# Patient Record
Sex: Male | Born: 1935 | Race: White | Hispanic: No | Marital: Married
Health system: Southern US, Community
[De-identification: ages and names within clinical notes are randomized; demographics above are authoritative.]

---

## 2004-12-23 ENCOUNTER — Emergency Department: Payer: Self-pay | Admitting: Unknown Physician Specialty

## 2005-09-24 ENCOUNTER — Inpatient Hospital Stay: Payer: Self-pay | Admitting: Psychiatry

## 2005-09-24 ENCOUNTER — Other Ambulatory Visit: Payer: Self-pay

## 2009-06-27 ENCOUNTER — Emergency Department: Payer: Self-pay | Admitting: Emergency Medicine

## 2011-01-28 ENCOUNTER — Ambulatory Visit: Payer: Self-pay | Admitting: Otolaryngology

## 2011-02-11 ENCOUNTER — Ambulatory Visit: Payer: Self-pay | Admitting: Otolaryngology

## 2011-03-14 ENCOUNTER — Ambulatory Visit: Payer: Self-pay | Admitting: Internal Medicine

## 2011-05-20 ENCOUNTER — Ambulatory Visit: Payer: Self-pay | Admitting: Internal Medicine

## 2011-06-11 ENCOUNTER — Ambulatory Visit: Payer: Self-pay | Admitting: Vascular Surgery

## 2011-06-12 ENCOUNTER — Ambulatory Visit: Payer: Self-pay | Admitting: Vascular Surgery

## 2011-06-18 ENCOUNTER — Inpatient Hospital Stay: Payer: Self-pay | Admitting: Vascular Surgery

## 2011-06-20 LAB — PATHOLOGY REPORT

## 2011-08-15 ENCOUNTER — Observation Stay: Payer: Self-pay | Admitting: Internal Medicine

## 2011-08-22 ENCOUNTER — Inpatient Hospital Stay: Payer: Self-pay | Admitting: Vascular Surgery

## 2011-08-25 LAB — PATHOLOGY REPORT

## 2011-12-08 ENCOUNTER — Ambulatory Visit: Payer: Self-pay | Admitting: Ophthalmology

## 2012-01-26 ENCOUNTER — Ambulatory Visit: Payer: Self-pay | Admitting: Ophthalmology

## 2012-04-07 ENCOUNTER — Emergency Department: Payer: Self-pay | Admitting: Emergency Medicine

## 2012-04-07 LAB — TROPONIN I
Troponin-I: <0.02 ng/mL
Troponin-I: 0.03 ng/mL

## 2012-04-07 LAB — URINALYSIS, COMPLETE
Bilirubin,UR: NEGATIVE
Glucose,UR: NEGATIVE mg/dL (ref 0–75)
Leukocyte Esterase: NEGATIVE
Nitrite: NEGATIVE
Ph: 8 (ref 4.5–8.0)
Protein: NEGATIVE
RBC,UR: 2 /HPF (ref 0–5)
Specific Gravity: 1.008 (ref 1.003–1.030)
Squamous Epithelial: NONE SEEN

## 2012-04-07 LAB — COMPREHENSIVE METABOLIC PANEL
Albumin: 4.5 g/dL (ref 3.4–5.0)
Alkaline Phosphatase: 76 U/L (ref 50–136)
BUN: 13 mg/dL (ref 7–18)
Chloride: 97 mmol/L — ABNORMAL LOW (ref 98–107)
Co2: 28 mmol/L (ref 21–32)
Creatinine: 0.95 mg/dL (ref 0.60–1.30)
EGFR (African American): >60
EGFR (Non-African Amer.): >60
Glucose: 104 mg/dL — ABNORMAL HIGH (ref 65–99)
Potassium: 4.5 mmol/L (ref 3.5–5.1)
SGOT(AST): 27 U/L (ref 15–37)
Sodium: 131 mmol/L — ABNORMAL LOW (ref 136–145)
Total Protein: 8.2 g/dL (ref 6.4–8.2)

## 2012-04-07 LAB — CBC
HGB: 15.7 g/dL (ref 13.0–18.0)
MCH: 31.6 pg (ref 26.0–34.0)
RBC: 4.98 10*6/uL (ref 4.40–5.90)
RDW: 13.1 % (ref 11.5–14.5)
WBC: 8.6 10*3/uL (ref 3.8–10.6)

## 2012-04-19 ENCOUNTER — Ambulatory Visit: Payer: Self-pay | Admitting: Otolaryngology

## 2014-03-16 LAB — CBC
HCT: 43.1 % (ref 40.0–52.0)
HGB: 14.3 g/dL (ref 13.0–18.0)
MCH: 31.2 pg (ref 26.0–34.0)
MCHC: 33.1 g/dL (ref 32.0–36.0)
MCV: 94 fL (ref 80–100)
PLATELETS: 236 10*3/uL (ref 150–440)
RBC: 4.58 10*6/uL (ref 4.40–5.90)
RDW: 12.9 % (ref 11.5–14.5)
WBC: 8.7 10*3/uL (ref 3.8–10.6)

## 2014-03-16 LAB — COMPREHENSIVE METABOLIC PANEL
Albumin: 4 g/dL (ref 3.4–5.0)
Alkaline Phosphatase: 79 U/L
Anion Gap: 9 (ref 7–16)
BUN: 14 mg/dL (ref 7–18)
Bilirubin,Total: 0.5 mg/dL (ref 0.2–1.0)
CHLORIDE: 91 mmol/L — AB (ref 98–107)
CO2: 28 mmol/L (ref 21–32)
CREATININE: 1.07 mg/dL (ref 0.60–1.30)
Calcium, Total: 9.6 mg/dL (ref 8.5–10.1)
EGFR (African American): >60
Glucose: 116 mg/dL — ABNORMAL HIGH (ref 65–99)
Osmolality: 259 (ref 275–301)
POTASSIUM: 4 mmol/L (ref 3.5–5.1)
SGOT(AST): 20 U/L (ref 15–37)
SGPT (ALT): 18 U/L (ref 12–78)
SODIUM: 128 mmol/L — AB (ref 136–145)
Total Protein: 7.5 g/dL (ref 6.4–8.2)

## 2014-03-16 LAB — URINALYSIS, COMPLETE
BLOOD: NEGATIVE
Bilirubin,UR: NEGATIVE
Glucose,UR: NEGATIVE mg/dL (ref 0–75)
Ketone: NEGATIVE
Nitrite: NEGATIVE
PH: 7 (ref 4.5–8.0)
PROTEIN: NEGATIVE
RBC,UR: 3 /HPF (ref 0–5)
SPECIFIC GRAVITY: 1.009 (ref 1.003–1.030)
SQUAMOUS EPITHELIAL: NONE SEEN
WBC UR: 5 /HPF (ref 0–5)

## 2014-03-16 LAB — TROPONIN I: Troponin-I: <0.02 ng/mL

## 2014-03-17 ENCOUNTER — Observation Stay: Payer: Self-pay | Admitting: Internal Medicine

## 2014-03-17 LAB — COMPREHENSIVE METABOLIC PANEL
ALK PHOS: 63 U/L
ALT: 18 U/L (ref 12–78)
AST: 23 U/L (ref 15–37)
Albumin: 3.7 g/dL (ref 3.4–5.0)
Anion Gap: 9 (ref 7–16)
BUN: 14 mg/dL (ref 7–18)
Bilirubin,Total: 0.8 mg/dL (ref 0.2–1.0)
CALCIUM: 9.8 mg/dL (ref 8.5–10.1)
CHLORIDE: 96 mmol/L — AB (ref 98–107)
Co2: 26 mmol/L (ref 21–32)
Creatinine: 0.89 mg/dL (ref 0.60–1.30)
EGFR (African American): >60
EGFR (Non-African Amer.): >60
Glucose: 101 mg/dL — ABNORMAL HIGH (ref 65–99)
OSMOLALITY: 263 (ref 275–301)
Potassium: 3.9 mmol/L (ref 3.5–5.1)
Sodium: 131 mmol/L — ABNORMAL LOW (ref 136–145)
Total Protein: 7 g/dL (ref 6.4–8.2)

## 2014-03-17 LAB — CBC WITH DIFFERENTIAL/PLATELET
BASOS ABS: 0 10*3/uL (ref 0.0–0.1)
BASOS PCT: 0.6 %
EOS ABS: 0.1 10*3/uL (ref 0.0–0.7)
Eosinophil %: 1.1 %
HCT: 41.9 % (ref 40.0–52.0)
HGB: 14.3 g/dL (ref 13.0–18.0)
Lymphocyte #: 1.9 10*3/uL (ref 1.0–3.6)
Lymphocyte %: 24.8 %
MCH: 31.9 pg (ref 26.0–34.0)
MCHC: 34.2 g/dL (ref 32.0–36.0)
MCV: 93 fL (ref 80–100)
MONO ABS: 0.7 x10 3/mm (ref 0.2–1.0)
MONOS PCT: 9.6 %
NEUTROS ABS: 4.9 10*3/uL (ref 1.4–6.5)
Neutrophil %: 63.9 %
Platelet: 204 10*3/uL (ref 150–440)
RBC: 4.5 10*6/uL (ref 4.40–5.90)
RDW: 12.7 % (ref 11.5–14.5)
WBC: 7.7 10*3/uL (ref 3.8–10.6)

## 2014-03-17 LAB — LIPID PANEL
CHOLESTEROL: 109 mg/dL (ref 0–200)
HDL Cholesterol: 47 mg/dL (ref 40–60)
Ldl Cholesterol, Calc: 47 mg/dL (ref 0–100)
TRIGLYCERIDES: 76 mg/dL (ref 0–200)
VLDL CHOLESTEROL, CALC: 15 mg/dL (ref 5–40)

## 2014-03-17 LAB — TSH: Thyroid Stimulating Horm: 0.64 u[IU]/mL

## 2014-03-17 LAB — MAGNESIUM: Magnesium: 1.9 mg/dL

## 2014-03-18 LAB — CBC WITH DIFFERENTIAL/PLATELET
Basophil #: 0 10*3/uL (ref 0.0–0.1)
Basophil %: 0.6 %
EOS PCT: 1.4 %
Eosinophil #: 0.1 10*3/uL (ref 0.0–0.7)
HCT: 45.4 % (ref 40.0–52.0)
HGB: 15.5 g/dL (ref 13.0–18.0)
Lymphocyte #: 1.8 10*3/uL (ref 1.0–3.6)
Lymphocyte %: 23.6 %
MCH: 32.1 pg (ref 26.0–34.0)
MCHC: 34.2 g/dL (ref 32.0–36.0)
MCV: 94 fL (ref 80–100)
MONO ABS: 0.9 x10 3/mm (ref 0.2–1.0)
Monocyte %: 11.4 %
NEUTROS ABS: 4.8 10*3/uL (ref 1.4–6.5)
Neutrophil %: 63 %
PLATELETS: 209 10*3/uL (ref 150–440)
RBC: 4.85 10*6/uL (ref 4.40–5.90)
RDW: 13.1 % (ref 11.5–14.5)
WBC: 7.6 10*3/uL (ref 3.8–10.6)

## 2014-03-18 LAB — BASIC METABOLIC PANEL
Anion Gap: 4 — ABNORMAL LOW (ref 7–16)
BUN: 13 mg/dL (ref 7–18)
CALCIUM: 9.9 mg/dL (ref 8.5–10.1)
Chloride: 99 mmol/L (ref 98–107)
Co2: 27 mmol/L (ref 21–32)
Creatinine: 1 mg/dL (ref 0.60–1.30)
EGFR (African American): >60
GLUCOSE: 104 mg/dL — AB (ref 65–99)
Osmolality: 261 (ref 275–301)
POTASSIUM: 3.9 mmol/L (ref 3.5–5.1)
Sodium: 130 mmol/L — ABNORMAL LOW (ref 136–145)

## 2014-03-27 ENCOUNTER — Ambulatory Visit: Payer: Self-pay | Admitting: Hematology and Oncology

## 2014-04-10 ENCOUNTER — Ambulatory Visit: Payer: Self-pay | Admitting: Otolaryngology

## 2014-04-20 ENCOUNTER — Emergency Department: Payer: Self-pay | Admitting: Emergency Medicine

## 2014-04-20 ENCOUNTER — Ambulatory Visit: Payer: Self-pay | Admitting: Otolaryngology

## 2014-04-20 LAB — COMPREHENSIVE METABOLIC PANEL
ALT: 23 U/L (ref 12–78)
Albumin: 3.9 g/dL (ref 3.4–5.0)
Alkaline Phosphatase: 78 U/L
Anion Gap: 5 — ABNORMAL LOW (ref 7–16)
BILIRUBIN TOTAL: 0.6 mg/dL (ref 0.2–1.0)
BUN: 15 mg/dL (ref 7–18)
CALCIUM: 10.3 mg/dL — AB (ref 8.5–10.1)
CHLORIDE: 95 mmol/L — AB (ref 98–107)
Co2: 28 mmol/L (ref 21–32)
Creatinine: 0.84 mg/dL (ref 0.60–1.30)
Glucose: 104 mg/dL — ABNORMAL HIGH (ref 65–99)
OSMOLALITY: 258 (ref 275–301)
Potassium: 5.1 mmol/L (ref 3.5–5.1)
SGOT(AST): 35 U/L (ref 15–37)
Sodium: 128 mmol/L — ABNORMAL LOW (ref 136–145)
TOTAL PROTEIN: 7.5 g/dL (ref 6.4–8.2)

## 2014-04-20 LAB — CBC WITH DIFFERENTIAL/PLATELET
Basophil #: 0.1 10*3/uL (ref 0.0–0.1)
Basophil %: 0.8 %
EOS ABS: 0.1 10*3/uL (ref 0.0–0.7)
Eosinophil %: 1.2 %
HCT: 44.1 % (ref 40.0–52.0)
HGB: 14.6 g/dL (ref 13.0–18.0)
Lymphocyte #: 1.8 10*3/uL (ref 1.0–3.6)
Lymphocyte %: 22.6 %
MCH: 30.9 pg (ref 26.0–34.0)
MCHC: 33.1 g/dL (ref 32.0–36.0)
MCV: 93 fL (ref 80–100)
MONOS PCT: 10.5 %
Monocyte #: 0.8 x10 3/mm (ref 0.2–1.0)
NEUTROS PCT: 64.9 %
Neutrophil #: 5.2 10*3/uL (ref 1.4–6.5)
PLATELETS: 241 10*3/uL (ref 150–440)
RBC: 4.72 10*6/uL (ref 4.40–5.90)
RDW: 13.1 % (ref 11.5–14.5)
WBC: 8 10*3/uL (ref 3.8–10.6)

## 2014-04-20 LAB — URINALYSIS, COMPLETE
BILIRUBIN, UR: NEGATIVE
Blood: NEGATIVE
GLUCOSE, UR: NEGATIVE mg/dL (ref 0–75)
Ketone: NEGATIVE
NITRITE: NEGATIVE
PH: 6 (ref 4.5–8.0)
PROTEIN: NEGATIVE
SPECIFIC GRAVITY: 1.012 (ref 1.003–1.030)
SQUAMOUS EPITHELIAL: NONE SEEN

## 2014-04-20 LAB — TROPONIN I: Troponin-I: <0.02 ng/mL

## 2014-04-21 ENCOUNTER — Inpatient Hospital Stay: Payer: Self-pay | Admitting: Internal Medicine

## 2014-04-21 ENCOUNTER — Ambulatory Visit: Payer: Self-pay | Admitting: Hematology and Oncology

## 2014-04-22 LAB — BASIC METABOLIC PANEL
Anion Gap: 7 (ref 7–16)
BUN: 14 mg/dL (ref 7–18)
CO2: 27 mmol/L (ref 21–32)
CREATININE: 0.96 mg/dL (ref 0.60–1.30)
Calcium, Total: 9.4 mg/dL (ref 8.5–10.1)
Chloride: 99 mmol/L (ref 98–107)
EGFR (African American): >60
EGFR (Non-African Amer.): >60
GLUCOSE: 146 mg/dL — AB (ref 65–99)
Osmolality: 269 (ref 275–301)
POTASSIUM: 4.2 mmol/L (ref 3.5–5.1)
Sodium: 133 mmol/L — ABNORMAL LOW (ref 136–145)

## 2014-04-22 LAB — CBC WITH DIFFERENTIAL/PLATELET
BASOS PCT: 0.2 %
Basophil #: 0 10*3/uL (ref 0.0–0.1)
EOS ABS: 0 10*3/uL (ref 0.0–0.7)
EOS PCT: 0 %
HCT: 41.8 % (ref 40.0–52.0)
HGB: 14 g/dL (ref 13.0–18.0)
Lymphocyte #: 0.8 10*3/uL — ABNORMAL LOW (ref 1.0–3.6)
Lymphocyte %: 17.6 %
MCH: 31.1 pg (ref 26.0–34.0)
MCHC: 33.4 g/dL (ref 32.0–36.0)
MCV: 93 fL (ref 80–100)
MONO ABS: 0.1 x10 3/mm — AB (ref 0.2–1.0)
Monocyte %: 1.4 %
NEUTROS PCT: 80.8 %
Neutrophil #: 3.9 10*3/uL (ref 1.4–6.5)
Platelet: 229 10*3/uL (ref 150–440)
RBC: 4.49 10*6/uL (ref 4.40–5.90)
RDW: 13 % (ref 11.5–14.5)
WBC: 4.8 10*3/uL (ref 3.8–10.6)

## 2014-04-22 LAB — MAGNESIUM: MAGNESIUM: 2.1 mg/dL

## 2014-04-23 LAB — CBC WITH DIFFERENTIAL/PLATELET
Basophil #: 0 10*3/uL (ref 0.0–0.1)
Basophil %: 0.2 %
EOS ABS: 0 10*3/uL (ref 0.0–0.7)
EOS PCT: 0 %
HCT: 42.2 % (ref 40.0–52.0)
HGB: 14.2 g/dL (ref 13.0–18.0)
Lymphocyte #: 1.3 10*3/uL (ref 1.0–3.6)
Lymphocyte %: 7.9 %
MCH: 31.6 pg (ref 26.0–34.0)
MCHC: 33.7 g/dL (ref 32.0–36.0)
MCV: 94 fL (ref 80–100)
MONOS PCT: 3.8 %
Monocyte #: 0.6 x10 3/mm (ref 0.2–1.0)
Neutrophil #: 14.1 10*3/uL — ABNORMAL HIGH (ref 1.4–6.5)
Neutrophil %: 88.1 %
Platelet: 251 10*3/uL (ref 150–440)
RBC: 4.49 10*6/uL (ref 4.40–5.90)
RDW: 13 % (ref 11.5–14.5)
WBC: 16 10*3/uL — ABNORMAL HIGH (ref 3.8–10.6)

## 2014-04-23 LAB — BASIC METABOLIC PANEL
Anion Gap: 6 — ABNORMAL LOW (ref 7–16)
BUN: 18 mg/dL (ref 7–18)
CALCIUM: 9.6 mg/dL (ref 8.5–10.1)
CREATININE: 1.04 mg/dL (ref 0.60–1.30)
Chloride: 99 mmol/L (ref 98–107)
Co2: 29 mmol/L (ref 21–32)
EGFR (Non-African Amer.): >60
Glucose: 141 mg/dL — ABNORMAL HIGH (ref 65–99)
Osmolality: 273 (ref 275–301)
POTASSIUM: 4.2 mmol/L (ref 3.5–5.1)
SODIUM: 134 mmol/L — AB (ref 136–145)

## 2014-04-24 LAB — CBC WITH DIFFERENTIAL/PLATELET
Basophil #: 0 10*3/uL (ref 0.0–0.1)
Basophil %: 0.1 %
EOS ABS: 0 10*3/uL (ref 0.0–0.7)
Eosinophil %: 0 %
HCT: 43.6 % (ref 40.0–52.0)
HGB: 14.3 g/dL (ref 13.0–18.0)
Lymphocyte #: 0.8 10*3/uL — ABNORMAL LOW (ref 1.0–3.6)
Lymphocyte %: 5.7 %
MCH: 30.6 pg (ref 26.0–34.0)
MCHC: 32.9 g/dL (ref 32.0–36.0)
MCV: 93 fL (ref 80–100)
Monocyte #: 0.4 x10 3/mm (ref 0.2–1.0)
Monocyte %: 3.4 %
NEUTROS ABS: 11.9 10*3/uL — AB (ref 1.4–6.5)
Neutrophil %: 90.8 %
PLATELETS: 256 10*3/uL (ref 150–440)
RBC: 4.68 10*6/uL (ref 4.40–5.90)
RDW: 13.2 % (ref 11.5–14.5)
WBC: 13.1 10*3/uL — ABNORMAL HIGH (ref 3.8–10.6)

## 2014-04-24 LAB — BASIC METABOLIC PANEL
ANION GAP: 5 — AB (ref 7–16)
BUN: 15 mg/dL (ref 7–18)
CALCIUM: 9.8 mg/dL (ref 8.5–10.1)
CO2: 30 mmol/L (ref 21–32)
Chloride: 99 mmol/L (ref 98–107)
Creatinine: 1 mg/dL (ref 0.60–1.30)
EGFR (African American): >60
Glucose: 132 mg/dL — ABNORMAL HIGH (ref 65–99)
Osmolality: 271 (ref 275–301)
Potassium: 4.2 mmol/L (ref 3.5–5.1)
SODIUM: 134 mmol/L — AB (ref 136–145)

## 2014-04-25 LAB — CBC WITH DIFFERENTIAL/PLATELET
BASOS ABS: 0 10*3/uL (ref 0.0–0.1)
BASOS PCT: 0.1 %
EOS PCT: 0 %
Eosinophil #: 0 10*3/uL (ref 0.0–0.7)
HCT: 47.7 % (ref 40.0–52.0)
HGB: 15.4 g/dL (ref 13.0–18.0)
Lymphocyte #: 0.8 10*3/uL — ABNORMAL LOW (ref 1.0–3.6)
Lymphocyte %: 6.2 %
MCH: 30.3 pg (ref 26.0–34.0)
MCHC: 32.3 g/dL (ref 32.0–36.0)
MCV: 94 fL (ref 80–100)
Monocyte #: 0.4 x10 3/mm (ref 0.2–1.0)
Monocyte %: 3.1 %
Neutrophil #: 10.9 10*3/uL — ABNORMAL HIGH (ref 1.4–6.5)
Neutrophil %: 90.6 %
Platelet: 264 10*3/uL (ref 150–440)
RBC: 5.08 10*6/uL (ref 4.40–5.90)
RDW: 13.5 % (ref 11.5–14.5)
WBC: 12.1 10*3/uL — ABNORMAL HIGH (ref 3.8–10.6)

## 2014-04-25 LAB — APTT: ACTIVATED PTT: 28.7 s (ref 23.6–35.9)

## 2014-04-25 LAB — BASIC METABOLIC PANEL
Anion Gap: 5 — ABNORMAL LOW (ref 7–16)
BUN: 19 mg/dL — ABNORMAL HIGH (ref 7–18)
CALCIUM: 9.6 mg/dL (ref 8.5–10.1)
Chloride: 96 mmol/L — ABNORMAL LOW (ref 98–107)
Co2: 33 mmol/L — ABNORMAL HIGH (ref 21–32)
Creatinine: 1.02 mg/dL (ref 0.60–1.30)
GLUCOSE: 147 mg/dL — AB (ref 65–99)
OSMOLALITY: 273 (ref 275–301)
Potassium: 3.9 mmol/L (ref 3.5–5.1)
SODIUM: 134 mmol/L — AB (ref 136–145)

## 2014-04-25 LAB — PROTIME-INR
INR: 1.1
Prothrombin Time: 13.9 secs (ref 11.5–14.7)

## 2014-04-26 ENCOUNTER — Ambulatory Visit: Payer: Self-pay | Admitting: Hematology and Oncology

## 2014-04-26 LAB — BASIC METABOLIC PANEL
ANION GAP: 4 — AB (ref 7–16)
BUN: 18 mg/dL (ref 7–18)
CALCIUM: 9.4 mg/dL (ref 8.5–10.1)
CHLORIDE: 97 mmol/L — AB (ref 98–107)
CO2: 33 mmol/L — AB (ref 21–32)
Creatinine: 0.82 mg/dL (ref 0.60–1.30)
EGFR (African American): >60
EGFR (Non-African Amer.): >60
Glucose: 137 mg/dL — ABNORMAL HIGH (ref 65–99)
OSMOLALITY: 272 (ref 275–301)
POTASSIUM: 3.8 mmol/L (ref 3.5–5.1)
Sodium: 134 mmol/L — ABNORMAL LOW (ref 136–145)

## 2014-04-26 LAB — CBC WITH DIFFERENTIAL/PLATELET
BASOS ABS: 0 10*3/uL (ref 0.0–0.1)
Basophil %: 0.1 %
EOS PCT: 0 %
Eosinophil #: 0 10*3/uL (ref 0.0–0.7)
HCT: 48.8 % (ref 40.0–52.0)
HGB: 15.7 g/dL (ref 13.0–18.0)
LYMPHS ABS: 0.7 10*3/uL — AB (ref 1.0–3.6)
Lymphocyte %: 6 %
MCH: 30.4 pg (ref 26.0–34.0)
MCHC: 32.2 g/dL (ref 32.0–36.0)
MCV: 94 fL (ref 80–100)
MONO ABS: 0.6 x10 3/mm (ref 0.2–1.0)
Monocyte %: 4.9 %
NEUTROS ABS: 10.3 10*3/uL — AB (ref 1.4–6.5)
Neutrophil %: 89 %
Platelet: 246 10*3/uL (ref 150–440)
RBC: 5.18 10*6/uL (ref 4.40–5.90)
RDW: 13.3 % (ref 11.5–14.5)
WBC: 11.6 10*3/uL — ABNORMAL HIGH (ref 3.8–10.6)

## 2014-04-27 LAB — PATHOLOGY REPORT

## 2014-04-28 LAB — BASIC METABOLIC PANEL
Anion Gap: 5 — ABNORMAL LOW (ref 7–16)
BUN: 31 mg/dL — ABNORMAL HIGH (ref 7–18)
CREATININE: 0.92 mg/dL (ref 0.60–1.30)
Calcium, Total: 9.2 mg/dL (ref 8.5–10.1)
Chloride: 97 mmol/L — ABNORMAL LOW (ref 98–107)
Co2: 33 mmol/L — ABNORMAL HIGH (ref 21–32)
EGFR (Non-African Amer.): >60
Glucose: 137 mg/dL — ABNORMAL HIGH (ref 65–99)
OSMOLALITY: 279 (ref 275–301)
POTASSIUM: 4.3 mmol/L (ref 3.5–5.1)
Sodium: 135 mmol/L — ABNORMAL LOW (ref 136–145)

## 2014-04-28 LAB — CBC WITH DIFFERENTIAL/PLATELET
BASOS PCT: 0.2 %
Basophil #: 0 10*3/uL (ref 0.0–0.1)
EOS ABS: 0 10*3/uL (ref 0.0–0.7)
Eosinophil %: 0 %
HCT: 41.1 % (ref 40.0–52.0)
HGB: 13.9 g/dL (ref 13.0–18.0)
LYMPHS PCT: 6.5 %
Lymphocyte #: 0.8 10*3/uL — ABNORMAL LOW (ref 1.0–3.6)
MCH: 31.7 pg (ref 26.0–34.0)
MCHC: 33.8 g/dL (ref 32.0–36.0)
MCV: 94 fL (ref 80–100)
Monocyte #: 0.9 x10 3/mm (ref 0.2–1.0)
Monocyte %: 6.7 %
NEUTROS PCT: 86.6 %
Neutrophil #: 11.2 10*3/uL — ABNORMAL HIGH (ref 1.4–6.5)
Platelet: 218 10*3/uL (ref 150–440)
RBC: 4.38 10*6/uL — ABNORMAL LOW (ref 4.40–5.90)
RDW: 13.3 % (ref 11.5–14.5)
WBC: 12.9 10*3/uL — ABNORMAL HIGH (ref 3.8–10.6)

## 2014-04-30 LAB — BASIC METABOLIC PANEL WITH GFR
Anion Gap: 4 — ABNORMAL LOW
BUN: 31 mg/dL — ABNORMAL HIGH
Calcium, Total: 10.2 mg/dL — ABNORMAL HIGH
Chloride: 94 mmol/L — ABNORMAL LOW
Co2: 35 mmol/L — ABNORMAL HIGH
Creatinine: 0.98 mg/dL
EGFR (African American): >60
EGFR (Non-African Amer.): >60
Glucose: 99 mg/dL
Osmolality: 273
Potassium: 4.6 mmol/L
Sodium: 133 mmol/L — ABNORMAL LOW

## 2014-04-30 LAB — CBC WITH DIFFERENTIAL/PLATELET
Basophil #: 0.1 x10 3/mm 3
Basophil %: 0.3 %
Eosinophil #: 0 x10 3/mm 3
Eosinophil %: 0.2 %
HCT: 47.2 %
HGB: 15.9 g/dL
Lymphocyte %: 13.2 %
Lymphs Abs: 2.4 x10 3/mm 3
MCH: 31.8 pg
MCHC: 33.7 g/dL
MCV: 94 fL
Monocyte #: 1.8 "x10 3/mm " — ABNORMAL HIGH
Monocyte %: 9.9 %
Neutrophil #: 14.1 x10 3/mm 3 — ABNORMAL HIGH
Neutrophil %: 76.4 %
Platelet: 235 x10 3/mm 3
RBC: 5.01 x10 6/mm 3
RDW: 13.3 %
WBC: 18.5 x10 3/mm 3 — ABNORMAL HIGH

## 2014-05-01 LAB — CBC WITH DIFFERENTIAL/PLATELET
BASOS ABS: 0 10*3/uL (ref 0.0–0.1)
BASOS PCT: 0.4 %
Basophil #: 0 10*3/uL (ref 0.0–0.1)
Basophil %: 0.2 %
EOS ABS: 0.1 10*3/uL (ref 0.0–0.7)
Eosinophil #: 0.1 10*3/uL (ref 0.0–0.7)
Eosinophil %: 0.6 %
Eosinophil %: 1 %
HCT: 44.7 % (ref 40.0–52.0)
HCT: 47.9 % (ref 40.0–52.0)
HGB: 14.6 g/dL (ref 13.0–18.0)
HGB: 15.3 g/dL (ref 13.0–18.0)
LYMPHS ABS: 1.4 10*3/uL (ref 1.0–3.6)
LYMPHS PCT: 14.3 %
LYMPHS PCT: 14.9 %
Lymphocyte #: 1.9 10*3/uL (ref 1.0–3.6)
MCH: 30.9 pg (ref 26.0–34.0)
MCH: 30.5 pg (ref 26.0–34.0)
MCHC: 32.7 g/dL (ref 32.0–36.0)
MCHC: 31.9 g/dL — ABNORMAL LOW (ref 32.0–36.0)
MCV: 94 fL (ref 80–100)
MCV: 96 fL (ref 80–100)
Monocyte #: 1.1 x10 3/mm — ABNORMAL HIGH (ref 0.2–1.0)
Monocyte #: 1.2 x10 3/mm — ABNORMAL HIGH (ref 0.2–1.0)
Monocyte %: 11.6 %
Monocyte %: 9.5 %
NEUTROS PCT: 73.3 %
Neutrophil #: 7.1 10*3/uL — ABNORMAL HIGH (ref 1.4–6.5)
Neutrophil #: 9.4 10*3/uL — ABNORMAL HIGH (ref 1.4–6.5)
Neutrophil %: 74.2 %
PLATELETS: 169 10*3/uL (ref 150–440)
Platelet: 172 10*3/uL (ref 150–440)
RBC: 4.74 10*6/uL (ref 4.40–5.90)
RBC: 5 10*6/uL (ref 4.40–5.90)
RDW: 13.1 % (ref 11.5–14.5)
RDW: 13.5 % (ref 11.5–14.5)
WBC: 9.7 10*3/uL (ref 3.8–10.6)
WBC: 12.6 10*3/uL — ABNORMAL HIGH (ref 3.8–10.6)

## 2014-05-01 LAB — BASIC METABOLIC PANEL
Anion Gap: 3 — ABNORMAL LOW (ref 7–16)
Anion Gap: 5 — ABNORMAL LOW (ref 7–16)
BUN: 31 mg/dL — AB (ref 7–18)
BUN: 28 mg/dL — AB (ref 7–18)
Calcium, Total: 9.3 mg/dL (ref 8.5–10.1)
Calcium, Total: 9.3 mg/dL (ref 8.5–10.1)
Chloride: 96 mmol/L — ABNORMAL LOW (ref 98–107)
Chloride: 99 mmol/L (ref 98–107)
Co2: 35 mmol/L — ABNORMAL HIGH (ref 21–32)
Co2: 31 mmol/L (ref 21–32)
Creatinine: 1.14 mg/dL (ref 0.60–1.30)
Creatinine: 1.2 mg/dL (ref 0.60–1.30)
EGFR (African American): >60
EGFR (Non-African Amer.): 57 — ABNORMAL LOW
Glucose: 92 mg/dL (ref 65–99)
Glucose: 99 mg/dL (ref 65–99)
Osmolality: 274 (ref 275–301)
Osmolality: 276 (ref 275–301)
POTASSIUM: 4.4 mmol/L (ref 3.5–5.1)
Potassium: 4.6 mmol/L (ref 3.5–5.1)
SODIUM: 134 mmol/L — AB (ref 136–145)
Sodium: 135 mmol/L — ABNORMAL LOW (ref 136–145)

## 2014-05-01 LAB — TROPONIN I: TROPONIN-I: 0.07 ng/mL — AB

## 2014-05-01 LAB — CK TOTAL AND CKMB (NOT AT ARMC)
CK, Total: 94 U/L
CK-MB: 2.8 ng/mL (ref 0.5–3.6)

## 2014-05-02 LAB — URINALYSIS, COMPLETE
Bilirubin,UR: NEGATIVE
Blood: NEGATIVE
Glucose,UR: NEGATIVE mg/dL (ref 0–75)
Ketone: NEGATIVE
NITRITE: NEGATIVE
PH: 7 (ref 4.5–8.0)
Protein: NEGATIVE
RBC,UR: 4 /HPF (ref 0–5)
SPECIFIC GRAVITY: 1.009 (ref 1.003–1.030)
Squamous Epithelial: NONE SEEN
WBC UR: 3 /HPF (ref 0–5)

## 2014-05-02 LAB — BASIC METABOLIC PANEL
ANION GAP: 4 — AB (ref 7–16)
BUN: 24 mg/dL — ABNORMAL HIGH (ref 7–18)
CALCIUM: 9.1 mg/dL (ref 8.5–10.1)
Chloride: 98 mmol/L (ref 98–107)
Co2: 33 mmol/L — ABNORMAL HIGH (ref 21–32)
Creatinine: 1.12 mg/dL (ref 0.60–1.30)
EGFR (African American): >60
Glucose: 145 mg/dL — ABNORMAL HIGH (ref 65–99)
Osmolality: 277 (ref 275–301)
POTASSIUM: 4.1 mmol/L (ref 3.5–5.1)
Sodium: 135 mmol/L — ABNORMAL LOW (ref 136–145)

## 2014-05-02 LAB — CBC WITH DIFFERENTIAL/PLATELET
BASOS PCT: 0.6 %
Basophil #: 0.1 10*3/uL (ref 0.0–0.1)
EOS PCT: 1.2 %
Eosinophil #: 0.2 10*3/uL (ref 0.0–0.7)
HCT: 43.3 % (ref 40.0–52.0)
HGB: 14.1 g/dL (ref 13.0–18.0)
LYMPHS ABS: 2 10*3/uL (ref 1.0–3.6)
LYMPHS PCT: 12.7 %
MCH: 30.7 pg (ref 26.0–34.0)
MCHC: 32.4 g/dL (ref 32.0–36.0)
MCV: 95 fL (ref 80–100)
MONO ABS: 1.4 x10 3/mm — AB (ref 0.2–1.0)
Monocyte %: 8.8 %
Neutrophil #: 12 10*3/uL — ABNORMAL HIGH (ref 1.4–6.5)
Neutrophil %: 76.7 %
Platelet: 165 10*3/uL (ref 150–440)
RBC: 4.58 10*6/uL (ref 4.40–5.90)
RDW: 13.3 % (ref 11.5–14.5)
WBC: 15.6 10*3/uL — ABNORMAL HIGH (ref 3.8–10.6)

## 2014-05-03 LAB — CBC WITH DIFFERENTIAL/PLATELET
Basophil #: 0 10*3/uL (ref 0.0–0.1)
Basophil %: 0.1 %
EOS PCT: 0.2 %
Eosinophil #: 0 10*3/uL (ref 0.0–0.7)
HCT: 40.6 % (ref 40.0–52.0)
HGB: 13.2 g/dL (ref 13.0–18.0)
Lymphocyte #: 0.8 10*3/uL — ABNORMAL LOW (ref 1.0–3.6)
Lymphocyte %: 5.8 %
MCH: 30.4 pg (ref 26.0–34.0)
MCHC: 32.5 g/dL (ref 32.0–36.0)
MCV: 93 fL (ref 80–100)
Monocyte #: 0.2 x10 3/mm (ref 0.2–1.0)
Monocyte %: 1.6 %
NEUTROS PCT: 92.3 %
Neutrophil #: 12.8 10*3/uL — ABNORMAL HIGH (ref 1.4–6.5)
Platelet: 156 10*3/uL (ref 150–440)
RBC: 4.35 10*6/uL — ABNORMAL LOW (ref 4.40–5.90)
RDW: 12.9 % (ref 11.5–14.5)
WBC: 13.9 10*3/uL — ABNORMAL HIGH (ref 3.8–10.6)

## 2014-05-03 LAB — URINE CULTURE

## 2014-05-03 LAB — BASIC METABOLIC PANEL
ANION GAP: 6 — AB (ref 7–16)
BUN: 14 mg/dL (ref 7–18)
CHLORIDE: 95 mmol/L — AB (ref 98–107)
Calcium, Total: 9.3 mg/dL (ref 8.5–10.1)
Co2: 32 mmol/L (ref 21–32)
Creatinine: 0.86 mg/dL (ref 0.60–1.30)
Glucose: 124 mg/dL — ABNORMAL HIGH (ref 65–99)
OSMOLALITY: 268 (ref 275–301)
POTASSIUM: 4.4 mmol/L (ref 3.5–5.1)
SODIUM: 133 mmol/L — AB (ref 136–145)

## 2014-05-04 LAB — CBC WITH DIFFERENTIAL/PLATELET
BASOS PCT: 0.1 %
Basophil #: 0 10*3/uL (ref 0.0–0.1)
EOS ABS: 0 10*3/uL (ref 0.0–0.7)
EOS PCT: 0 %
HCT: 38.5 % — ABNORMAL LOW (ref 40.0–52.0)
HGB: 12.9 g/dL — ABNORMAL LOW (ref 13.0–18.0)
LYMPHS ABS: 1.2 10*3/uL (ref 1.0–3.6)
LYMPHS PCT: 7.5 %
MCH: 31.8 pg (ref 26.0–34.0)
MCHC: 33.4 g/dL (ref 32.0–36.0)
MCV: 95 fL (ref 80–100)
Monocyte #: 0.5 x10 3/mm (ref 0.2–1.0)
Monocyte %: 3.3 %
Neutrophil #: 14.7 10*3/uL — ABNORMAL HIGH (ref 1.4–6.5)
Neutrophil %: 89.1 %
Platelet: 163 10*3/uL (ref 150–440)
RBC: 4.04 10*6/uL — ABNORMAL LOW (ref 4.40–5.90)
RDW: 13.2 % (ref 11.5–14.5)
WBC: 16.5 10*3/uL — ABNORMAL HIGH (ref 3.8–10.6)

## 2014-05-04 LAB — BASIC METABOLIC PANEL
Anion Gap: 7 (ref 7–16)
BUN: 16 mg/dL (ref 7–18)
CHLORIDE: 97 mmol/L — AB (ref 98–107)
Calcium, Total: 9 mg/dL (ref 8.5–10.1)
Co2: 31 mmol/L (ref 21–32)
Creatinine: 0.75 mg/dL (ref 0.60–1.30)
EGFR (African American): >60
EGFR (Non-African Amer.): >60
GLUCOSE: 134 mg/dL — AB (ref 65–99)
Osmolality: 273 (ref 275–301)
POTASSIUM: 4.3 mmol/L (ref 3.5–5.1)
SODIUM: 135 mmol/L — AB (ref 136–145)

## 2014-05-04 LAB — VANCOMYCIN, TROUGH: VANCOMYCIN, TROUGH: 8 ug/mL — AB (ref 10–20)

## 2014-05-12 ENCOUNTER — Ambulatory Visit: Payer: Self-pay | Admitting: Hematology and Oncology

## 2014-05-12 LAB — COMPREHENSIVE METABOLIC PANEL
ALBUMIN: 3.1 g/dL — AB (ref 3.4–5.0)
ALK PHOS: 90 U/L
Anion Gap: 5 — ABNORMAL LOW (ref 7–16)
BILIRUBIN TOTAL: 0.7 mg/dL (ref 0.2–1.0)
BUN: 13 mg/dL (ref 7–18)
CREATININE: 0.76 mg/dL (ref 0.60–1.30)
Calcium, Total: 9.5 mg/dL (ref 8.5–10.1)
Chloride: 96 mmol/L — ABNORMAL LOW (ref 98–107)
Co2: 31 mmol/L (ref 21–32)
Glucose: 96 mg/dL (ref 65–99)
Osmolality: 264 (ref 275–301)
Potassium: 4.5 mmol/L (ref 3.5–5.1)
SGOT(AST): 21 U/L (ref 15–37)
SGPT (ALT): 28 U/L (ref 12–78)
Sodium: 132 mmol/L — ABNORMAL LOW (ref 136–145)
Total Protein: 6 g/dL — ABNORMAL LOW (ref 6.4–8.2)

## 2014-05-12 LAB — CBC CANCER CENTER
BASOS ABS: 0 x10 3/mm (ref 0.0–0.1)
Basophil %: 1 %
Eosinophil #: 0.1 x10 3/mm (ref 0.0–0.7)
Eosinophil %: 2 %
HCT: 34.5 % — ABNORMAL LOW (ref 40.0–52.0)
HGB: 11.7 g/dL — AB (ref 13.0–18.0)
LYMPHS ABS: 0.9 x10 3/mm — AB (ref 1.0–3.6)
LYMPHS PCT: 30 %
MCH: 31.1 pg (ref 26.0–34.0)
MCHC: 34 g/dL (ref 32.0–36.0)
MCV: 92 fL (ref 80–100)
Monocyte #: 0.1 x10 3/mm — ABNORMAL LOW (ref 0.2–1.0)
Monocyte %: 2 %
Neutrophil #: 2 x10 3/mm (ref 1.4–6.5)
Neutrophil %: 65 %
Platelet: 94 x10 3/mm — ABNORMAL LOW (ref 150–440)
RBC: 3.76 10*6/uL — AB (ref 4.40–5.90)
RDW: 12.7 % (ref 11.5–14.5)
WBC: 3.1 x10 3/mm — AB (ref 3.8–10.6)

## 2014-05-24 LAB — COMPREHENSIVE METABOLIC PANEL
ALT: 21 U/L
ANION GAP: 2 — AB (ref 7–16)
Albumin: 3.3 g/dL — ABNORMAL LOW (ref 3.4–5.0)
Alkaline Phosphatase: 94 U/L
BILIRUBIN TOTAL: 0.3 mg/dL (ref 0.2–1.0)
BUN: 12 mg/dL (ref 7–18)
CALCIUM: 10 mg/dL (ref 8.5–10.1)
CREATININE: 1.04 mg/dL (ref 0.60–1.30)
Chloride: 97 mmol/L — ABNORMAL LOW (ref 98–107)
Co2: 34 mmol/L — ABNORMAL HIGH (ref 21–32)
EGFR (African American): >60
EGFR (Non-African Amer.): >60
GLUCOSE: 94 mg/dL (ref 65–99)
OSMOLALITY: 266 (ref 275–301)
Potassium: 4.4 mmol/L (ref 3.5–5.1)
SGOT(AST): 15 U/L (ref 15–37)
SODIUM: 133 mmol/L — AB (ref 136–145)
Total Protein: 6.4 g/dL (ref 6.4–8.2)

## 2014-05-24 LAB — CBC CANCER CENTER
Basophil #: 0 x10 3/mm (ref 0.0–0.1)
Basophil %: 0.6 %
EOS ABS: 0 x10 3/mm (ref 0.0–0.7)
EOS PCT: 0.9 %
HCT: 37.8 % — AB (ref 40.0–52.0)
HGB: 13 g/dL (ref 13.0–18.0)
LYMPHS ABS: 2 x10 3/mm (ref 1.0–3.6)
LYMPHS PCT: 36.5 %
MCH: 32.1 pg (ref 26.0–34.0)
MCHC: 34.5 g/dL (ref 32.0–36.0)
MCV: 93 fL (ref 80–100)
MONO ABS: 1.2 x10 3/mm — AB (ref 0.2–1.0)
Monocyte %: 22.4 %
NEUTROS PCT: 39.6 %
Neutrophil #: 2.2 x10 3/mm (ref 1.4–6.5)
Platelet: 268 x10 3/mm (ref 150–440)
RBC: 4.07 10*6/uL — AB (ref 4.40–5.90)
RDW: 14.2 % (ref 11.5–14.5)
WBC: 5.4 x10 3/mm (ref 3.8–10.6)

## 2014-05-27 ENCOUNTER — Ambulatory Visit: Payer: Self-pay | Admitting: Hematology and Oncology

## 2014-05-31 LAB — CBC CANCER CENTER
Basophil #: 0 x10 3/mm (ref 0.0–0.1)
Basophil %: 0.3 %
Eosinophil #: 0 x10 3/mm (ref 0.0–0.7)
Eosinophil %: 0.2 %
HCT: 35.6 % — AB (ref 40.0–52.0)
HGB: 11.7 g/dL — AB (ref 13.0–18.0)
LYMPHS PCT: 12.2 %
Lymphocyte #: 1.7 x10 3/mm (ref 1.0–3.6)
MCH: 30.9 pg (ref 26.0–34.0)
MCHC: 32.8 g/dL (ref 32.0–36.0)
MCV: 94 fL (ref 80–100)
MONO ABS: 0.7 x10 3/mm (ref 0.2–1.0)
Monocyte %: 4.7 %
NEUTROS ABS: 11.6 x10 3/mm — AB (ref 1.4–6.5)
NEUTROS PCT: 82.6 %
Platelet: 264 x10 3/mm (ref 150–440)
RBC: 3.78 10*6/uL — ABNORMAL LOW (ref 4.40–5.90)
RDW: 14.5 % (ref 11.5–14.5)
WBC: 14.1 x10 3/mm — ABNORMAL HIGH (ref 3.8–10.6)

## 2014-06-07 LAB — CBC CANCER CENTER
BANDS NEUTROPHIL: 8 %
HCT: 39.3 % — ABNORMAL LOW (ref 40.0–52.0)
HGB: 12.7 g/dL — ABNORMAL LOW (ref 13.0–18.0)
Lymphocytes: 17 %
MCH: 30.7 pg (ref 26.0–34.0)
MCHC: 32.4 g/dL (ref 32.0–36.0)
MCV: 95 fL (ref 80–100)
METAMYELOCYTE: 5 %
MONOS PCT: 4 %
Myelocyte: 7 %
NRBC/100 WBC: 1 /100
Platelet: 146 x10 3/mm — ABNORMAL LOW (ref 150–440)
Promyelocyte: 4 %
RBC: 4.14 10*6/uL — AB (ref 4.40–5.90)
RDW: 15.2 % — ABNORMAL HIGH (ref 11.5–14.5)
Segmented Neutrophils: 53 %
Variant Lymphocyte: 2 %
WBC: 20.4 x10 3/mm — ABNORMAL HIGH (ref 3.8–10.6)

## 2014-06-07 LAB — HEPATIC FUNCTION PANEL A (ARMC)
Albumin: 3.6 g/dL (ref 3.4–5.0)
Alkaline Phosphatase: 113 U/L
Bilirubin,Total: 0.3 mg/dL (ref 0.2–1.0)
SGOT(AST): 17 U/L (ref 15–37)
SGPT (ALT): 22 U/L
Total Protein: 6.5 g/dL (ref 6.4–8.2)

## 2014-06-07 LAB — CREATININE, SERUM
CREATININE: 1.01 mg/dL (ref 0.60–1.30)
EGFR (African American): >60

## 2014-06-14 LAB — BASIC METABOLIC PANEL
Anion Gap: 7 (ref 7–16)
BUN: 5 mg/dL — ABNORMAL LOW (ref 7–18)
CO2: 31 mmol/L (ref 21–32)
CREATININE: 1.13 mg/dL (ref 0.60–1.30)
Calcium, Total: 9.6 mg/dL (ref 8.5–10.1)
Chloride: 98 mmol/L (ref 98–107)
EGFR (African American): >60
EGFR (Non-African Amer.): >60
Glucose: 99 mg/dL (ref 65–99)
Osmolality: 269 (ref 275–301)
POTASSIUM: 4.6 mmol/L (ref 3.5–5.1)
Sodium: 136 mmol/L (ref 136–145)

## 2014-06-14 LAB — CBC CANCER CENTER
BASOS ABS: 0.1 x10 3/mm (ref 0.0–0.1)
Basophil %: 0.7 %
EOS ABS: 0.1 x10 3/mm (ref 0.0–0.7)
EOS PCT: 0.3 %
HCT: 41.1 % (ref 40.0–52.0)
HGB: 13.6 g/dL (ref 13.0–18.0)
LYMPHS PCT: 15.5 %
Lymphocyte #: 2.7 x10 3/mm (ref 1.0–3.6)
MCH: 31.3 pg (ref 26.0–34.0)
MCHC: 33.1 g/dL (ref 32.0–36.0)
MCV: 95 fL (ref 80–100)
Monocyte #: 1.4 x10 3/mm — ABNORMAL HIGH (ref 0.2–1.0)
Monocyte %: 7.9 %
NEUTROS ABS: 13.1 x10 3/mm — AB (ref 1.4–6.5)
Neutrophil %: 75.6 %
Platelet: 160 x10 3/mm (ref 150–440)
RBC: 4.36 10*6/uL — AB (ref 4.40–5.90)
RDW: 16.1 % — AB (ref 11.5–14.5)
WBC: 17.4 x10 3/mm — AB (ref 3.8–10.6)

## 2014-06-21 LAB — CBC CANCER CENTER
BASOS ABS: 0.1 x10 3/mm (ref 0.0–0.1)
Basophil %: 0.4 %
EOS PCT: 0.2 %
Eosinophil #: 0 x10 3/mm (ref 0.0–0.7)
HCT: 34.5 % — AB (ref 40.0–52.0)
HGB: 11.3 g/dL — AB (ref 13.0–18.0)
LYMPHS ABS: 1.9 x10 3/mm (ref 1.0–3.6)
LYMPHS PCT: 9.6 %
MCH: 30.5 pg (ref 26.0–34.0)
MCHC: 32.7 g/dL (ref 32.0–36.0)
MCV: 93 fL (ref 80–100)
Monocyte #: 0.5 x10 3/mm (ref 0.2–1.0)
Monocyte %: 2.7 %
Neutrophil #: 17 x10 3/mm — ABNORMAL HIGH (ref 1.4–6.5)
Neutrophil %: 87.1 %
Platelet: 252 x10 3/mm (ref 150–440)
RBC: 3.7 10*6/uL — ABNORMAL LOW (ref 4.40–5.90)
RDW: 15.9 % — ABNORMAL HIGH (ref 11.5–14.5)
WBC: 19.6 x10 3/mm — AB (ref 3.8–10.6)

## 2014-06-27 ENCOUNTER — Ambulatory Visit: Payer: Self-pay | Admitting: Hematology and Oncology

## 2014-07-05 LAB — BASIC METABOLIC PANEL
Anion Gap: 7 (ref 7–16)
BUN: 7 mg/dL (ref 7–18)
CALCIUM: 9.6 mg/dL (ref 8.5–10.1)
CREATININE: 0.98 mg/dL (ref 0.60–1.30)
Chloride: 99 mmol/L (ref 98–107)
Co2: 29 mmol/L (ref 21–32)
EGFR (African American): >60
EGFR (Non-African Amer.): >60
Glucose: 124 mg/dL — ABNORMAL HIGH (ref 65–99)
Osmolality: 269 (ref 275–301)
Potassium: 4.3 mmol/L (ref 3.5–5.1)
Sodium: 135 mmol/L — ABNORMAL LOW (ref 136–145)

## 2014-07-05 LAB — CBC CANCER CENTER
BASOS PCT: 0.9 %
Basophil #: 0.1 x10 3/mm (ref 0.0–0.1)
EOS ABS: 0.1 x10 3/mm (ref 0.0–0.7)
EOS PCT: 1.3 %
HCT: 37.6 % — ABNORMAL LOW (ref 40.0–52.0)
HGB: 12.4 g/dL — ABNORMAL LOW (ref 13.0–18.0)
LYMPHS ABS: 2.1 x10 3/mm (ref 1.0–3.6)
Lymphocyte %: 22.7 %
MCH: 31.3 pg (ref 26.0–34.0)
MCHC: 32.9 g/dL (ref 32.0–36.0)
MCV: 95 fL (ref 80–100)
MONO ABS: 0.7 x10 3/mm (ref 0.2–1.0)
Monocyte %: 7.8 %
Neutrophil #: 6.2 x10 3/mm (ref 1.4–6.5)
Neutrophil %: 67.3 %
PLATELETS: 150 x10 3/mm (ref 150–440)
RBC: 3.96 10*6/uL — AB (ref 4.40–5.90)
RDW: 17.8 % — ABNORMAL HIGH (ref 11.5–14.5)
WBC: 9.2 x10 3/mm (ref 3.8–10.6)

## 2014-07-12 ENCOUNTER — Ambulatory Visit: Payer: Self-pay | Admitting: Vascular Surgery

## 2014-07-19 LAB — BASIC METABOLIC PANEL
Anion Gap: 6 — ABNORMAL LOW (ref 7–16)
BUN: 10 mg/dL (ref 7–18)
CO2: 30 mmol/L (ref 21–32)
Calcium, Total: 9.9 mg/dL (ref 8.5–10.1)
Chloride: 95 mmol/L — ABNORMAL LOW (ref 98–107)
Creatinine: 0.92 mg/dL (ref 0.60–1.30)
GLUCOSE: 123 mg/dL — AB (ref 65–99)
Osmolality: 263 (ref 275–301)
Potassium: 4.5 mmol/L (ref 3.5–5.1)
SODIUM: 131 mmol/L — AB (ref 136–145)

## 2014-07-19 LAB — CBC CANCER CENTER
BASOS PCT: 0.7 %
Basophil #: 0.1 x10 3/mm (ref 0.0–0.1)
EOS ABS: 0.2 x10 3/mm (ref 0.0–0.7)
Eosinophil %: 2.7 %
HCT: 37.1 % — ABNORMAL LOW (ref 40.0–52.0)
HGB: 12.3 g/dL — AB (ref 13.0–18.0)
LYMPHS ABS: 1.3 x10 3/mm (ref 1.0–3.6)
LYMPHS PCT: 19.7 %
MCH: 31.4 pg (ref 26.0–34.0)
MCHC: 33.2 g/dL (ref 32.0–36.0)
MCV: 95 fL (ref 80–100)
MONO ABS: 0.7 x10 3/mm (ref 0.2–1.0)
Monocyte %: 10.6 %
NEUTROS ABS: 4.5 x10 3/mm (ref 1.4–6.5)
Neutrophil %: 66.3 %
PLATELETS: 213 x10 3/mm (ref 150–440)
RBC: 3.92 10*6/uL — AB (ref 4.40–5.90)
RDW: 16.5 % — ABNORMAL HIGH (ref 11.5–14.5)
WBC: 6.8 x10 3/mm (ref 3.8–10.6)

## 2014-07-19 LAB — HEPATIC FUNCTION PANEL A (ARMC)
ALT: 20 U/L
AST: 17 U/L (ref 15–37)
Albumin: 3.4 g/dL (ref 3.4–5.0)
Alkaline Phosphatase: 80 U/L
BILIRUBIN TOTAL: 0.6 mg/dL (ref 0.2–1.0)
Bilirubin, Direct: 0.1 mg/dL (ref 0.00–0.20)
TOTAL PROTEIN: 6.3 g/dL — AB (ref 6.4–8.2)

## 2014-07-26 LAB — CBC CANCER CENTER
Basophil #: 0 x10 3/mm (ref 0.0–0.1)
Basophil %: 0.1 %
Eosinophil #: 0.2 x10 3/mm (ref 0.0–0.7)
Eosinophil %: 0.5 %
HCT: 36.8 % — ABNORMAL LOW (ref 40.0–52.0)
HGB: 12.1 g/dL — ABNORMAL LOW (ref 13.0–18.0)
LYMPHS ABS: 2.2 x10 3/mm (ref 1.0–3.6)
Lymphocyte %: 6.3 %
MCH: 31.3 pg (ref 26.0–34.0)
MCHC: 32.9 g/dL (ref 32.0–36.0)
MCV: 95 fL (ref 80–100)
Monocyte #: 0.7 x10 3/mm (ref 0.2–1.0)
Monocyte %: 2 %
NEUTROS ABS: 30.9 x10 3/mm — AB (ref 1.4–6.5)
NEUTROS PCT: 91.1 %
PLATELETS: 233 x10 3/mm (ref 150–440)
RBC: 3.87 10*6/uL — ABNORMAL LOW (ref 4.40–5.90)
RDW: 15.5 % — ABNORMAL HIGH (ref 11.5–14.5)
WBC: 34 x10 3/mm — ABNORMAL HIGH (ref 3.8–10.6)

## 2014-07-27 ENCOUNTER — Ambulatory Visit: Payer: Self-pay | Admitting: Hematology and Oncology

## 2014-08-02 LAB — CBC CANCER CENTER
BASOS PCT: 0.5 %
Basophil #: 0 x10 3/mm (ref 0.0–0.1)
EOS PCT: 0.7 %
Eosinophil #: 0.1 x10 3/mm (ref 0.0–0.7)
HCT: 39.8 % — ABNORMAL LOW (ref 40.0–52.0)
HGB: 13.1 g/dL (ref 13.0–18.0)
Lymphocyte #: 1.8 x10 3/mm (ref 1.0–3.6)
Lymphocyte %: 19.3 %
MCH: 31.3 pg (ref 26.0–34.0)
MCHC: 32.8 g/dL (ref 32.0–36.0)
MCV: 96 fL (ref 80–100)
Monocyte #: 0.4 x10 3/mm (ref 0.2–1.0)
Monocyte %: 4 %
NEUTROS ABS: 7.2 x10 3/mm — AB (ref 1.4–6.5)
NEUTROS PCT: 75.5 %
PLATELETS: 148 x10 3/mm — AB (ref 150–440)
RBC: 4.17 10*6/uL — AB (ref 4.40–5.90)
RDW: 15.8 % — ABNORMAL HIGH (ref 11.5–14.5)
WBC: 9.5 x10 3/mm (ref 3.8–10.6)

## 2014-08-02 LAB — CREATININE, SERUM
CREATININE: 1.01 mg/dL (ref 0.60–1.30)
EGFR (Non-African Amer.): >60

## 2014-08-02 LAB — HEPATIC FUNCTION PANEL A (ARMC)
Albumin: 3.8 g/dL (ref 3.4–5.0)
Alkaline Phosphatase: 114 U/L
BILIRUBIN DIRECT: 0.1 mg/dL (ref 0.00–0.20)
Bilirubin,Total: 0.5 mg/dL (ref 0.2–1.0)
SGOT(AST): 12 U/L — ABNORMAL LOW (ref 15–37)
SGPT (ALT): 18 U/L
Total Protein: 6.6 g/dL (ref 6.4–8.2)

## 2014-08-27 ENCOUNTER — Ambulatory Visit: Payer: Self-pay | Admitting: Hematology and Oncology

## 2014-09-05 LAB — CBC CANCER CENTER
BASOS PCT: 0.2 %
Basophil #: 0 x10 3/mm (ref 0.0–0.1)
EOS ABS: 0 x10 3/mm (ref 0.0–0.7)
Eosinophil %: 0.3 %
HCT: 41.8 % (ref 40.0–52.0)
HGB: 13.7 g/dL (ref 13.0–18.0)
LYMPHS ABS: 1.1 x10 3/mm (ref 1.0–3.6)
LYMPHS PCT: 8.8 %
MCH: 30.9 pg (ref 26.0–34.0)
MCHC: 32.7 g/dL (ref 32.0–36.0)
MCV: 94 fL (ref 80–100)
MONOS PCT: 6.1 %
Monocyte #: 0.8 x10 3/mm (ref 0.2–1.0)
NEUTROS PCT: 84.6 %
Neutrophil #: 10.5 x10 3/mm — ABNORMAL HIGH (ref 1.4–6.5)
Platelet: 229 x10 3/mm (ref 150–440)
RBC: 4.42 10*6/uL (ref 4.40–5.90)
RDW: 14.1 % (ref 11.5–14.5)
WBC: 12.4 x10 3/mm — AB (ref 3.8–10.6)

## 2014-09-12 LAB — CBC CANCER CENTER
BASOS ABS: 0 x10 3/mm (ref 0.0–0.1)
BASOS PCT: 0.2 %
EOS ABS: 0 x10 3/mm (ref 0.0–0.7)
EOS PCT: 0.2 %
HCT: 45.9 % (ref 40.0–52.0)
HGB: 14.8 g/dL (ref 13.0–18.0)
LYMPHS PCT: 7.8 %
Lymphocyte #: 1.8 x10 3/mm (ref 1.0–3.6)
MCH: 30.6 pg (ref 26.0–34.0)
MCHC: 32.2 g/dL (ref 32.0–36.0)
MCV: 95 fL (ref 80–100)
MONOS PCT: 3.6 %
Monocyte #: 0.8 x10 3/mm (ref 0.2–1.0)
NEUTROS ABS: 20 x10 3/mm — AB (ref 1.4–6.5)
Neutrophil %: 88.2 %
Platelet: 184 x10 3/mm (ref 150–440)
RBC: 4.82 10*6/uL (ref 4.40–5.90)
RDW: 14.4 % (ref 11.5–14.5)
WBC: 22.7 x10 3/mm — AB (ref 3.8–10.6)

## 2014-09-14 LAB — CBC CANCER CENTER
Basophil #: 0 x10 3/mm (ref 0.0–0.1)
Basophil %: 0.1 %
EOS ABS: 0 x10 3/mm (ref 0.0–0.7)
Eosinophil %: 0 %
HCT: 45.6 % (ref 40.0–52.0)
HGB: 14.5 g/dL (ref 13.0–18.0)
LYMPHS ABS: 1 x10 3/mm (ref 1.0–3.6)
Lymphocyte %: 6 %
MCH: 30.4 pg (ref 26.0–34.0)
MCHC: 31.9 g/dL — AB (ref 32.0–36.0)
MCV: 95 fL (ref 80–100)
MONOS PCT: 3.8 %
Monocyte #: 0.6 x10 3/mm (ref 0.2–1.0)
NEUTROS PCT: 90.1 %
Neutrophil #: 14.6 x10 3/mm — ABNORMAL HIGH (ref 1.4–6.5)
Platelet: 196 x10 3/mm (ref 150–440)
RBC: 4.78 10*6/uL (ref 4.40–5.90)
RDW: 14.3 % (ref 11.5–14.5)
WBC: 16.3 x10 3/mm — AB (ref 3.8–10.6)

## 2014-09-14 LAB — COMPREHENSIVE METABOLIC PANEL
ALK PHOS: 75 U/L
ALT: 23 U/L
Albumin: 3.9 g/dL (ref 3.4–5.0)
Anion Gap: 7 (ref 7–16)
BUN: 20 mg/dL — ABNORMAL HIGH (ref 7–18)
Bilirubin,Total: 0.6 mg/dL (ref 0.2–1.0)
CO2: 30 mmol/L (ref 21–32)
Calcium, Total: 10 mg/dL (ref 8.5–10.1)
Chloride: 97 mmol/L — ABNORMAL LOW (ref 98–107)
Creatinine: 1.02 mg/dL (ref 0.60–1.30)
EGFR (African American): >60
EGFR (Non-African Amer.): >60
GLUCOSE: 98 mg/dL (ref 65–99)
Osmolality: 271 (ref 275–301)
Potassium: 4.7 mmol/L (ref 3.5–5.1)
SGOT(AST): 12 U/L — ABNORMAL LOW (ref 15–37)
SODIUM: 134 mmol/L — AB (ref 136–145)
Total Protein: 7 g/dL (ref 6.4–8.2)

## 2014-09-14 LAB — MAGNESIUM: MAGNESIUM: 1.9 mg/dL

## 2014-09-25 LAB — CBC CANCER CENTER
Basophil #: 0 x10 3/mm (ref 0.0–0.1)
Basophil %: 0.2 %
Eosinophil #: 0 x10 3/mm (ref 0.0–0.7)
Eosinophil %: 0.1 %
HCT: 41.5 % (ref 40.0–52.0)
HGB: 13.8 g/dL (ref 13.0–18.0)
LYMPHS PCT: 7 %
Lymphocyte #: 0.5 x10 3/mm — ABNORMAL LOW (ref 1.0–3.6)
MCH: 31 pg (ref 26.0–34.0)
MCHC: 33.2 g/dL (ref 32.0–36.0)
MCV: 93 fL (ref 80–100)
Monocyte #: 0.2 x10 3/mm (ref 0.2–1.0)
Monocyte %: 2.3 %
NEUTROS PCT: 90.4 %
Neutrophil #: 6.6 x10 3/mm — ABNORMAL HIGH (ref 1.4–6.5)
Platelet: 162 x10 3/mm (ref 150–440)
RBC: 4.44 10*6/uL (ref 4.40–5.90)
RDW: 14.5 % (ref 11.5–14.5)
WBC: 7.3 x10 3/mm (ref 3.8–10.6)

## 2014-09-25 LAB — BASIC METABOLIC PANEL
Anion Gap: 5 — ABNORMAL LOW (ref 7–16)
BUN: 17 mg/dL (ref 7–18)
CALCIUM: 9.5 mg/dL (ref 8.5–10.1)
CHLORIDE: 97 mmol/L — AB (ref 98–107)
CO2: 32 mmol/L (ref 21–32)
Creatinine: 0.95 mg/dL (ref 0.60–1.30)
EGFR (Non-African Amer.): >60
GLUCOSE: 158 mg/dL — AB (ref 65–99)
Osmolality: 273 (ref 275–301)
Potassium: 4.7 mmol/L (ref 3.5–5.1)
SODIUM: 134 mmol/L — AB (ref 136–145)

## 2014-09-25 LAB — HEPATIC FUNCTION PANEL A (ARMC)
Albumin: 3.1 g/dL — ABNORMAL LOW (ref 3.4–5.0)
Alkaline Phosphatase: 68 U/L
BILIRUBIN TOTAL: 0.6 mg/dL (ref 0.2–1.0)
Bilirubin, Direct: 0.2 mg/dL (ref 0.0–0.2)
SGOT(AST): 14 U/L — ABNORMAL LOW (ref 15–37)
SGPT (ALT): 19 U/L
Total Protein: 6.3 g/dL — ABNORMAL LOW (ref 6.4–8.2)

## 2014-09-26 ENCOUNTER — Ambulatory Visit: Payer: Self-pay | Admitting: Internal Medicine

## 2014-10-27 DEATH — deceased

## 2015-02-17 NOTE — Consult Note (Signed)
Reason for Visit: This 81 year old Male patient presents to the clinic for initial evaluation of  small cell lungs cancer .   Referred by Dr Sherrlyn HockPandit.  Diagnosis:  Chief Complaint/Diagnosis   81 year old male with excellent response to palliative chemotherapy for extensive stage is for her small cell lung cancernow for whole brain radiation  Pathology Report pathology report reviewed   Imaging Report serial CT scans reviewed   Referral Report clinical notes reviewed   Planned Treatment Regimen whole brain radiation   HPI   patient is a 81 year old malewho presented with increasing shortness of breath dyspnea on exertion progressing to nasal oxygen. He was found to have a left lung mass with liver metastasis biopsy positive for small cell lung cancer.he receive palliative chemotherapy with 4 cycles of carboplatin and etoposideand has done extremely well. Recent CT scans showsignificant improvement in the extensive lung disease and left hilum. Also improved ill-defined hepatic lesions. He overall clinically is doing well with no cough hemoptysis or chest tightness. At this time he is referred to radiation oncology for consideration of whole brain radiation. Initial CT scans of the brain showed no evidence of disease.  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   COPD:    Hypertension:    CABG (Coronary Artery Bypass Graft):    Hernia Repair:   Past, Family and Social History:  Past Medical History positive   Cardiovascular CABG performed; hypertension   Respiratory COPD; emphysema   Neurological/Psychiatric peripheral neuropathy   Past Surgical History erniorrhaphy repair   Family History noncontributory   Social History positive   Social History Comments 40 pack year smoking history quit smoking in 2002 no EtOH abuse history   Additional Past Medical and Surgical History accompanied by multiple family members today    Allergies:   Tetracycline: Unknown  Levaquin: Hallucinations, Agitation  Trovan: Rash  Sulfa drugs: Rash, Swelling  Home Meds:  Home Medications: Medication Instructions Status  albuterol 2.5 mg/3 mL (0.083%) inhalation solution 3 milliliter(s) inhaled every 6 hours as needed for dyspnea or wheezing Active  acyclovir 400 mg oral tablet 1 tab(s) orally 3 times a day Active  traMADol 50 mg oral tablet 0.5 tab(s) orally 1 to 2 times a day Active  ALPRAZolam 0.25 mg oral tablet 1 tab(s) orally 3 times a day as needed for anxiety. Active  promethazine 12.5 mg oral tablet 1-2  tab(s) orally every 6 hours as needed for nausea or vomiting Active  nystatin topical 100000 units/g topical powder Apply topically to affected area 3 times a day Active  Aldactone 25 mg oral tablet 1 tab(s) orally 2 times a day Active  ProAir HFA CFC free 90 mcg/inh inhalation aerosol 2 puff(s) inhaled 4 times a day Active  Symbicort 160 mcg-4.5 mcg/inh inhalation aerosol 2 puff(s) inhaled 2 times a day Active  simvastatin 40 mg oral tablet 1 tab(s) orally once a day (at bedtime) Active  multivitamin 1 tab(s) orally once a day Active  PriLOSEC OTC 20 mg oral delayed release tablet 1 tab(s) orally once a day Active  selenium 50 mcg oral tablet 2 tab(s) orally once a day Active  Vitamin C 500 mg oral tablet 1 tab(s) orally once a day Active  aspirin 81 mg oral delayed release tablet 1 tab(s) orally once a day Active   Review of Systems:  General negative   Performance Status (ECOG) 0   Skin negative  Breast negative   Ophthalmologic negative   ENMT negative   Respiratory and Thorax see HPI   Cardiovascular negative   Gastrointestinal negative   Genitourinary negative   Musculoskeletal negative   Neurological negative   Psychiatric negative   Hematology/Lymphatics negative   Endocrine negative   Allergic/Immunologic negative   Review of Systems   denies any weight loss, fatigue,  weakness, fever, chills or night sweats. Patient denies any loss of vision, blurred vision. Patient denies any ringing  of the ears or hearing loss. No irregular heartbeat. Patient denies heart murmur or history of fainting. Patient denies any chest pain or pain radiating to her upper extremities. Patient denies any shortness of breath, difficulty breathing at night, cough or hemoptysis. Patient denies any swelling in the lower legs. Patient denies any nausea vomiting, vomiting of blood, or coffee ground material in the vomitus. Patient denies any stomach pain. Patient states has had normal bowel movements no significant constipation or diarrhea. Patient denies any dysuria, hematuria or significant nocturia. Patient denies any problems walking, swelling in the joints or loss of balance. Patient denies any skin changes, loss of hair or loss of weight. Patient denies any excessive worrying or anxiety or significant depression. Patient denies any problems with insomnia. Patient denies excessive thirst, polyuria, polydipsia. Patient denies any swollen glands, patient denies easy bruising or easy bleeding. Patient denies any recent infections, allergies or URI. Patient "s visual fields have not changed significantly in recent time.   Nursing Notes:  Nursing Vital Signs and Chemo Nursing Nursing Notes: *CC Vital Signs Flowsheet:   19-Oct-15 14:14  Temp Temperature 98.2  Pulse Pulse 83  Respirations Respirations 24  SBP SBP 99  DBP DBP 66  Pain Scale (0-10)  3  Current Weight (kg) (kg) 84.9   Physical Exam:  General/Skin/HEENT:  General normal   Skin normal   Eyes normal   ENMT normal   Head and Neck normal   Additional PE well-developed well-nourished male in NAD. Lungs are clear to A&P cardiac examination shows regular rate and rhythm abdomen is benign. Cranial nerves II through XII are grossly intact motor sensory and DTR levels are equal and symmetric in the upper and lower extremities.  Proprioception is intact visual fields within normal range.   Breasts/Resp/CV/GI/GU:  Respiratory and Thorax normal   Cardiovascular normal   Gastrointestinal normal   Genitourinary normal   MS/Neuro/Psych/Lymph:  Musculoskeletal normal   Neurological normal   Lymphatics normal   Other Results:  Radiology Results: LabUnknown:    04-Jul-15 14:10, CT Head Without Contrast  PACS Image     12-Aug-15 13:22, CT Chest With Contrast  PACS Image   CT:    04-Jul-15 14:10, CT Head Without Contrast  CT Head Without Contrast   REASON FOR EXAM:    Confusion, small cell lung cancer  COMMENTS:       PROCEDURE: CT  - CT HEAD WITHOUT CONTRAST  - Apr 29 2014  2:10PM     CLINICAL DATA:  Worsening confusion.  Hilar mass.    EXAM:  CT HEAD WITHOUT CONTRAST    TECHNIQUE:  Contiguousaxial images were obtained from the base of the skull  through the vertex without intravenous contrast.    COMPARISON:  MRI 03/17/2014.  CT 03/16/2014  FINDINGS:  The brain shows generalized atrophy. There is advanced confluent  chronic small vessel disease throughout the cerebral hemispheric  white matter. There is generalized atrophy. There are old small  vessel infarctions within the  cerebellum. No sign of acute  infarction, mass lesion, hemorrhage, hydrocephalus or extra-axial  collection. Left frontal scalp lipoma incidentally noted. No  calvarial abnormality. Sinuses are clear. There is atherosclerotic  calcification of the major vessels at the base of the brain.     IMPRESSION:  No acute finding. Atrophy and extensive chronic small vessel  ischemic changes.    Electronically Signed    By: Paulina Fusi M.D.    On: 04/29/2014 14:16         Verified By: Thomasenia Sales, M.D.,    12-Aug-15 13:22, CT Chest With Contrast  CT Chest With Contrast   REASON FOR EXAM:    Restaging Metastatic Lung CA on Chemo  COMMENTS:       PROCEDURE: CT  - CT CHEST WITH CONTRAST  - Jun 07 2014  1:22PM      CLINICAL DATA:  Restaging metastatic lung cancer. Receiving  chemotherapy currently.    EXAM:  CT CHEST WITHCONTRAST    TECHNIQUE:  Multidetector CT imaging of the chest was performed during  intravenous contrast administration.  CONTRAST:  75 cc Isovue 370 IV    COMPARISON:  04/21/2014    FINDINGS:  Severe centrilobular emphysema. Significant improvement in the left  hilar mass which currently measures 3.1 x 1.7 cm. When measured in  the same planes previously, this measured 4.8 x 3.6 cm previously.  Previously seen 3.1 cm left AP window lymph node currently measures  10 mm in short axis diameter. Extensive pleural-based tumor  peripherally in the left upper lobe significantly decreased, with a  current thickness measuring up to 9 mm compared to 3.1 cm  previously. Previously seen presumed chest wall invasion not  definitively visualized on today's study. Right upper lobe nodular  density also much improved, measuring up to 6 mm today compared to  13 x 9 mm previously. Posterior subpleural nodule in the right lower  lobe on image 33 measures 9 mm, stable. No pleural effusions.    Airspace disease in the left lower lobe again noted, likely  atelectasis, not significantly changed. Left paratracheal/AP window  lymph node on image 23 has a short axis diameter of 11 mm compared  with 14 mm previously.    Chest wall soft tissues are unremarkable. Heart is upper limits  normal in size. Severe diffuse coronary artery calcifications. Aorta  is calcified, non aneurysmal.    Imaging into the upper abdomen demonstrates improvement in the  ill-defined low-density lesions within the liver. Hepatic dome  lesion on image 50 measures 2.2 cm in greatest diameter compared  with 4.7 cm previously. Other smaller hypodensities again noted in  the liver inferiorly, stable or slightly improved. Mild fullness of  the adrenal glands bilaterally, likely hyperplasia.    No acute bony  abnormality or focal bone lesion. Mild compression  deformity in the mid thoracic spine, likely T9, slightly progressed.     IMPRESSION:  Significant improvement in the extensive disease previously seen in  the left lung. Improving pleural based masses throughout the left  hemithorax. Improving left hilar mass and AP window adenopathy.    Decreasing size of the right upper lobe nodule.    Improving ill-defined hepatic lesions.  Severe centrilobular emphysema and coronary artery disease.      Electronically Signed    By: Charlett Nose M.D.    On: 06/07/2014 13:59         Verified By: Cyndie Chime, M.D.,    07-Oct-15 12:25, CT  Chest With Contrast  CT Chest With Contrast   REASON FOR EXAM:    Restaging Metastatic Lung CA on Chemo  COMMENTS:       PROCEDURE: CT  - CT CHEST WITH CONTRAST  - Aug 02 2014 12:25PM     CLINICAL DATA:  Restaging metastatic lung cancer. Currently on  chemotherapy. Shortness of breath.    EXAM:  CT CHEST WITH CONTRAST    TECHNIQUE:  Multidetector CT imaging of the chest was performed during  intravenous contrast administration.  CONTRAST:  75 cc Isovue 370    COMPARISON:  06/07/2014    FINDINGS:  Lungs/Pleura: Advanced centrilobular emphysema. Probable scarring at  the right upper lobe. A nodule is unchanged at 6 mm on image 12.    Pleural-based right lower lobe nodule measures 9 mm on image 31 and  is similar.    Soft tissue thickening along the subpleural left upper lobe is a  similar to further improved. 8 mm maximally on image 19 today versus  9 mm at the same level on the prior exam. No well-defined mass in  this area.  No pleural fluid.    Heart/Mediastinum: The right-sided Port-A-Cath which terminates in  the mid SVC. No supraclavicular adenopathy. Prior median sternotomy.  Advanced aortic and branch vessel atherosclerosis. Mild  cardiomegaly, without pericardial effusion. No central pulmonary  embolism, on this non-dedicated  study. No well-defined mediastinal  or right hilar adenopathy. Left paratracheal 9 mm node on image 22  is decreased from 11 mm on the prior.    Soft tissue fullness in the left suprahilar region and lateral to  the AP window is improved. Soft tissue thickening in this area  measures maximally 2.3x 1.2 cm on image 26 today versus 3.1 x 1.7  cm at the same level on the prior exam. Left hemidiaphragm  elevation.  Upper Abdomen: Decreased conspicuity of previously described liver  lesions. This may be partially due to differences in bolus timing.  Subtle low-density index right liver lobe lesion at 1.2 cm on image  16 measured 1.5 cm on the prior. Old granulomatous disease in the  liver.    Normal imaged portions of the spleen, stomach, adrenal glands,  gallbladder. Mild renal cortical atrophy with probable left renal  cysts. Fatty atrophy throughout the pancreas.    Bones/Musculoskeletal: No suspicious osseous lesions. A mild  compression deformity of the T9 vertebral body is chronic. No canal  compromise.     IMPRESSION:  1. Response to therapy.Decrease in soft tissue fullness about the  left suprahilar and AP window region.  2. No residual well-defined pleural parenchymal mass identified.  3. Probable improvement in hepatic metastasis. Decreasing  conspicuity of incompletely imaged liver lesions. This could be  partially due to differences in bolus timing.  4. Advanced emphysema and atherosclerosis.      Electronically Signed    By: Jeronimo Greaves M.D.    On: 08/02/2014 15:53         Verified By: Consuello Bossier, M.D.,   Relevent Results:   Relevant Scans and Labs CT scans of chest and head reviewed   Assessment and Plan: Impression:   81 year old male withextensive stage small cell lung cancer with excellent response to palliative chemotherapy now for whole brain radiation. Plan:   t this time I would recommend whole brain radiation therapy 12/24/1978 centigray in 16  fractions to prevent brain failure from his extensive stage small cell lung cancer. Risks and benefits of treatment  including hair loss, possible decrease in cognitive function, skin reaction, alteration of blood counts, fatigue, or discussed in detail with the patient and his family. They all seem to comprehend my treatment plan well. I have set up and ordered CT simulationfor next week to allow him to recuperate somewhat further from his systemic chemotherapy. We'll also start him on low-dose steroid to protect his brain and possible slight edema during his radiation treatments.Have discussed the case personally with medical oncology.  I would like to take this opportunity for allowing me to participate in the care of your patient..  Fax to Physician:  Physicians To Recieve Fax: Barbette Reichmann, MD - [phone] Therese Sarah, MD - [phone].  Electronic Signatures: Zebadiah Willert, Gordy Councilman (MD)  (Signed 20-Oct-15 09:53)  Authored: HPI, Diagnosis, Past Hx, PFSH, Allergies, Home Meds, ROS, Nursing Notes, Physical Exam, Other Results, Relevent Results, Encounter Assessment and Plan, Fax to Physician   Last Updated: 20-Oct-15 09:53 by Rebeca Alert (MD)

## 2015-02-17 NOTE — Consult Note (Signed)
ONCOLOGY followup - still weakness, cough and dyspnea on exertion are same and continues on nasal cannula oxygen. Denies hemoptysis. Family present at bedside, please that he is more confused/disoriented today.eating fair. No fevers.sitting in chair, alert and oriented x3, NAD.          Vitals - 98.1, 100, 20, 109/81, 90% on 5L O2          HEENT - no oral thrush            Lungs - bilaterally diminish breath sounds overall, occasional rhonchi          Abdomen - soft, nontender.  7/3 - hemoglobin 13.9, WBC 12900, ANC [id number], platelets 218K, Cr 0.92. diagnosed metastatic small cell lung cancer ( had CT-guided left lung mass biopsy on 04/26/14) with liver metastasis - patient and family present were explained about biopsy confirming small cell lung cancer, that he has stage IV disease which is incurable and treatments offered are with palliative intent only. Patient unable to go home today given continued dyspnea, alongwith confusion. Have discussed treatment options and explained about palliative chemotherapy with regimen including carboplatin/etoposide and explained overall palliative intent of treatment, possible response rates and possible side effects. Have also advised that he needs to start chemotherapy as soon as possible given that this is an aggressive form of lung cancer. Given the ongoing confusion/disorientation, cannot start chemotherapy today, etiology is unclear it could be a combination of inadequate sleep, steroid therapy, possible alcohol withdrawal. Will give low-dose Ativan, get noncontrast CT head to rule out acute changes and followup. If confusion is better tomorrow, could start on chemotherapy as inpatient. No pain issues or hemoptysis. Will continue to follow. They are agreeable to this plan.   Electronic Signatures: Izola PricePandit, Torion Hulgan Raj (MD)  (Signed on 04-Jul-15 17:35)  Authored  Last Updated: 04-Jul-15 17:35 by Izola PricePandit, Kimbely Whiteaker Raj (MD)

## 2015-02-17 NOTE — H&P (Signed)
PATIENT NAME:  [name] [name], [name] [name] DATE OF BIRTH:  02-Feb-1932  DATE OF ADMISSION:  03/17/2014  PRIMARY CARE PHYSICIAN: [name] ReichmannVishwanath Hande, [name] [name]  REFERRING PHYSICIAN: Dr. Minna AntisKevin [name]  CHIEF COMPLAINT: Slurred speech and right hand weakness.   HISTORY OF PRESENT ILLNESS: The patient is a 81 year old Caucasian male with a past medical history of COPD, hypertension, coronary artery disease, and A-fib who presented to the ER with the chief complaint of short episode of slurred speech associated with numbness and weakness in the right upper extremity. The patient was unable to hold a cup with his hand and he dropped it. Also, the patient was complaining of dizziness at around 7:00 p.m. Family has called EMS and by the time EMS arrived, at 7:30 p.m., his symptoms were resolved. The patient had similar episode of numbness, slurred speech and right upper extremity weakness in the year 2012 and he had bilateral carotid endarterectomy done at that time. Following that, the patient has been getting carotid ultrasounds every 6 months approximately as recommended by their physician. During my examination, the patient is resting comfortably. Denies any chest pain, shortness of breath. Denies any headache, blurry vision. All his symptoms are resolved. Speech is back to normal. Daughter and wife are at bedside. No other complaints. They have reported that the patient is intermittently complaining of chest pain, but during my examination he is chest pain-free. The patient also has history of COPD. A CAT scan of the head is negative. Hospitalist team is called to admit the patient regarding TIA.   PAST MEDICAL HISTORY: Chronic history of COPD, hypertension, and coronary artery disease status post coronary artery bypass grafting.   PAST SURGICAL HISTORY: Coronary artery bypass grafting, status post bilateral carotid endarterectomy.  ALLERGIES: SULFA, TETRACYCLINE, AND TROVAN.  PSYCHOSOCIAL HISTORY: Lives  at home with wife. No smoking, alcohol or illicit drug usage.   FAMILY HISTORY: CVA and hypertension runs in his family.  REVIEW OF SYSTEMS: CONSTITUTIONAL: Denies any fever, fatigue. Complaining of weakness.  EYES: Denies blurry vision, double vision, glaucoma.  ENT: Denies epistaxis, discharge, snoring, postnasal drip, cough. Has chronic history of COPD. CARDIOVASCULAR: Intermittent episodes of chest pain, but the patient is currently chest pain free. No palpitations, syncope.  GASTROINTESTINAL: Denies nausea, vomiting, diarrhea, GERD.  GENITOURINARY: No dysuria or hematuria.  ENDOCRINE: Denies polyuria, nocturia, thyroid problems. No heat or cold intolerance. HEMATOLOGIC AND LYMPHATIC: No anemia, easy bruising, bleeding.  INTEGUMENTARY: No acne, rash, lesions.  MUSCULOSKELETAL: No joint pain in the neck and back. Denies gout.  NEUROLOGIC: Denies vertigo, ataxia, past medical history of TIA status post carotid endarterectomy. Has chronic tics and jerking motions. PSYCH: No ADD or OCD.    PHYSICAL EXAMINATION: VITAL SIGNS: Temperature is 98, pulse 82, respirations 18 to 20, blood pressure 123/86, pulse ox 94%.  GENERAL APPEARANCE: Not in acute distress. Moderately built and nourished.  HEENT: Normocephalic, atraumatic. Pupils are equally reactive to light and accommodation. No scleral icterus. No conjunctival injection. Looks like he has chronic tics and chronic jerking movements which is known baseline. According to the family members, this seems to be aggravated with anxiety. Moist mucous membranes. Oral cavity is intact.  NECK: Supple. No JVD. No thyromegaly. Range of motion is intact. Status post carotid endarterectomy bilaterally.  LUNGS: Clear to auscultation bilaterally. Moderate air entry. No wheezing. No rhonchi. The patient is reporting that he is at his baseline breathing as he has a chronic COPD. CARDIOVASCULAR: S1, S2 normal. Regular rate and rhythm. No  murmurs. Point of maximum  impulse is nondistended.  GASTROINTESTINAL: Soft. Bowel sounds are positive in all 4 quadrants. Non-tender. Non-distended. No hepatosplenomegaly. No masses felt.  NEUROLOGIC: Awake, alert, and oriented x3. Cranial nerves II through XII are grossly intact. No focal deficits. Motor is 5/5. Sensory is intact. Reflexes are 2+.  EXTREMITIES: No edema. No cyanosis. No clubbing.  SKIN: Warm to touch. Normal turgor. No rashes. No lesions.  MUSCULOSKELETAL: No joint effusion, tenderness, erythema.  PSYCHIATRIC: Anxious mood and flat affect.  DIAGNOSTIC DATA: Glucose 115, BUN 14, creatinine 1.07, sodium 128, potassium 4.8, chloride 91, CO2 normal. GFR is normal. Anion gap is 9. Serum osmolality and calcium are normal. LFTs are normal. Troponin less than 0.02. TSH is 0.64. CBC is normal. Urinalysis: Yellow in color, clear in appearance, glucose, bili and ketones are negative, nitrites are negative.   A 12-lead EKG has revealed normal sinus rhythm, first degree AV block, right bundle branch block and left anterior fascicular block.   CT of the head without contrast has revealed severely limited examination demonstrating no definite acute intracranial abnormalities. Mild cerebellar atrophy and extensive chronic microvascular ischemic changes throughout the cerebral white matter.   ASSESSMENT AND PLAN: A 81 year old pleasant Caucasian male presenting to the ER with the chief complaint of slurred speech, right upper extremity numbness and weakness for approximately 30 minutes, which has spontaneously resolved prior to his ER arrival.  Will be admitted with the following assessment and plan:  1.  Transient ischemic attack. CT head is negative. Status post bilateral carotid endarterectomy in the year 2012. We will obtain carotid Dopplers and 2-D echocardiogram. The patient will be provided with aspirin and statin. We will provide occupational therapy consult regarding right upper extremity weakness.  2.  Chronic  obstructive pulmonary disease. The patient is at his baseline. Continue nebulizer treatment, which is his home medication.  3.  Hypertension. Blood pressure is stable. We will allow permissive hypertension. If necessary we will up-titrate medications.  4.  Chronic hyponatremia. The patient's sodium is close to his baseline. Mentation is normal. No interventions needed at this time.  5.  We will provide gastrointestinal and deep vein thrombosis prophylaxis.   He is FULL code. Wife is the medical power of attorney. The patient will be transferred to Dr. Marcello Fennel.   TOTAL TIME SPENT ON ADMISSION: 50 minutes.   ____________________________ [name] Lab, [name] [name] ag:sb D: 03/17/2014 07:28:26 ET T: 03/17/2014 07:50:37 ET JOB#: [id number]  cc: [name] Lab, [name] [name], <Dictator> [name] Reichmann, [name] [name] [name] Lab [name] [name] ELECTRONICALLY SIGNED 03/30/2014 7:54

## 2015-02-17 NOTE — Consult Note (Signed)
Informed by RN that vitals taken at 12:49PM shows BP dropped to 73/50. Will give NS 500 mL IVF bolus, RN will notify Dr.Hande for further management. Given hypotension, unable to start chemo today especially since regimen contains etoposide, and until it is clear if hypotension is going to persist or not. Patient and family explained above details.  Electronic Signatures: Izola PricePandit, Toniette Devera Raj (MD)  (Signed on 05-Jul-15 16:57)  Authored  Last Updated: 05-Jul-15 16:57 by Izola PricePandit, Aquiles Ruffini Raj (MD)

## 2015-02-17 NOTE — Consult Note (Signed)
   Comments   Attempted to meet with pt to discuss goals of medical therapy.  Pt again refuses and states he just can't meet with me today and would think about it at another time.  Electronic Signatures: Reather LaurenceMantzouris, Jarrid Lienhard J (NP)  (Signed 03-Jul-15 16:02)  Authored: Palliative Care   Last Updated: 03-Jul-15 16:02 by Reather LaurenceMantzouris, [place] Doster J (NP)

## 2015-02-17 NOTE — Consult Note (Signed)
History of Present Illness:  Reason for Consult Metastatic lung cancer-biopsy pending   HPI   this is a 81-year-old male, who presented to the emergency room yesterday with chest pain over his right side of the chest and back as well as increasing shortness of breath. Patient says that this has been getting steadily worse over the past 2-3 weeks. He is not able to do any activity without getting significantly short of breath. Has been on oxygen 2 L nasal cannula since November 2014. Pulse ox today on 2 L nasal cannula was 85%.was seen in the emergency room a CT scan was done, which showed multiple lesions in chest as well as liver setting for metastatic disease and patient was asked to see us as an outpatient at the cancer Center today.is complaining of severe shortness of breath. No hemoptysis and no cough. Denies any fever or chills.no nausea, vomiting, or diarrhea. Denies any headaches.have a heavy smoking history but quit several years ago.lost about 10-15 pounds of weight in the past few months  PFSH:  Family History noncontributory   Social History negative alcohol, negative tobacco   Comments Smoker quit 2007   Review of Systems:  General pain  fatigue   Performance Status (ECOG) 3   Lungs SOB   Cardiac chest pain   Musculoskeletal back pain   Extremities no complaints   Skin no complaints   Neuro no complaints   Endocrine no complaints   Psych no complaints   NURSING NOTES: *CC Vital Signs Flowsheet:   26-Jun-15 11:40   Temp Temperature: 98.1   Pulse Pulse: 98   Respirations Respirations: 16   SBP SBP: 110   DBP DBP: 75   Pain Scale (0-10): 0   Physical Exam:  General Tachypneic in wheelchair   HEENT: normal   Lungs: decreased breath sounds   Cardiac: regular rate, rhythm   Breast: not examined   Skin: intact   Extremities: No edema, rash or cyanosis   Neuro: AAOx3   Psych: normal appearance     perp neuropathy: induced by  mediacations   COPD:    Hypertension:    CABG (Coronary Artery Bypass Graft):    Hernia Repair:    Tetracycline: Unknown  Levaquin: Hallucinations, Agitation  Trovan: Rash  Sulfa drugs: Rash, Swelling  Laboratory Results: Hepatic:  25-Jun-15 18:37   Bilirubin, Total 0.6  Alkaline Phosphatase 78 (45-117 NOTE: New Reference Range 09/16/13)  SGPT (ALT) 23  SGOT (AST) 35  Total Protein, Serum 7.5  Albumin, Serum 3.9  Routine Chem:  25-Jun-15 18:37   Glucose, Serum  104  BUN 15  Creatinine (comp) 0.84  Sodium, Serum  128  Potassium, Serum 5.1  Chloride, Serum  95  CO2, Serum 28  Calcium (Total), Serum  10.3  Osmolality (calc) 258  eGFR (African American) >60  eGFR (Non-African American) >60 (eGFR values <60mL/min/1.73 m2 may be an indication of chronic kidney disease (CKD). Calculated eGFR is useful in patients with stable renal function. The eGFR calculation will not be reliable in acutely ill patients when serum creatinine is changing rapidly. It is not useful in  patients on dialysis. The eGFR calculation may not be applicable to patients at the low and high extremes of body sizes, pregnant women, and vegetarians.)  Result Comment potassium/ast - Slight hemolysis, interpret results with  - caution.  Result(s) reported on 20 Apr 2014 at 08:10PM.  Anion Gap  5  Cardiac:  25-Jun-15 18:37   Troponin I < 0.02 (0.00-0.05   0.05 ng/mL or less: NEGATIVE  Repeat testing in 3-6 hrs  if clinically indicated. >0.05 ng/mL: POTENTIAL  MYOCARDIAL INJURY. Repeat  testing in 3-6 hrs if  clinically indicated. NOTE: An increase or decrease  of 30% or more on serial  testing suggests a  clinically important change)  Routine UA:  25-Jun-15 18:37   Color (UA) Yellow  Clarity (UA) Clear  Glucose (UA) Negative  Bilirubin (UA) Negative  Ketones (UA) Negative  Specific Gravity (UA) 1.012  Blood (UA) Negative  pH (UA) 6.0  Protein (UA) Negative  Nitrite (UA) Negative   Leukocyte Esterase (UA) Trace (Result(s) reported on 20 Apr 2014 at 07:55PM.)  RBC (UA) 3 /HPF  WBC (UA) 12 /HPF  Bacteria (UA) TRACE  Epithelial Cells (UA) NONE SEEN  Mucous (UA) PRESENT (Result(s) reported on 20 Apr 2014 at 07:55PM.)  Routine Hem:  25-Jun-15 18:37   WBC (CBC) 8.0  RBC (CBC) 4.72  Hemoglobin (CBC) 14.6  Hematocrit (CBC) 44.1  Platelet Count (CBC) 241  MCV 93  MCH 30.9  MCHC 33.1  RDW 13.1  Neutrophil % 64.9  Lymphocyte % 22.6  Monocyte % 10.5  Eosinophil % 1.2  Basophil % 0.8  Neutrophil # 5.2  Lymphocyte # 1.8  Monocyte # 0.8  Eosinophil # 0.1  Basophil # 0.1 (Result(s) reported on 20 Apr 2014 at 08:10PM.)   Assessment and Plan: Impression:   this is a 81 year old male with what looks like metastatic lung cancer on CT scan . Lesions. Both lungs as well as in the liver.plan on CT-guided biopsy of liver lesion once respiratory status stabilizedand family aware that this would be stage IV disease, and that options of therapy would depend on his performance status as well as on pathology.significantly tachypneic with decreased air entry bilaterally. Will need admission for pain control, as well as for COPD.with Dr Laurin Coder who has agreed to admit patient on hospitalist service. Plan:   as above  Electronic Signatures: Georges Mouse (MD)  (Signed 26-Jun-15 15:58)  Authored: HISTORY OF PRESENT ILLNESS, PFSH, ROS, NURSING NOTES, PE, PAST MEDICAL HISTORY, ALLERGIES, LABS, ASSESSMENT AND PLAN   Last Updated: 26-Jun-15 15:58 by Georges Mouse (MD)

## 2015-02-17 NOTE — Op Note (Signed)
PATIENT NAME:  Caryl PinaSTEELE, Curties G MR#:  [id number] DATE OF BIRTH:  1932/04/16  DATE OF PROCEDURE: 07/12/2014  DATE OF DICTATION: 07/12/2014   PREOPERATIVE DIAGNOSES:  1. Lung cancer.  2. Carotid artery stenosis. status post bilateral carotid endarterectomies.  3. Poor venous access.   POSTOPERATIVE DIAGNOSES:  1. Lung cancer.  2. Carotid artery stenosis. status post bilateral carotid endarterectomies.  3. Poor venous access.   PROCEDURES:  1. Ultrasound guidance for vascular access, right internal jugular vein.  2. Fluoroscopic guidance for placement of catheter.  3. Placement of CT compatible Port-A-Cath, right internal jugular vein.   SURGEON: Annice NeedyJason S. Dew, MD  ANESTHESIA: Local with moderate conscious sedation.   FLUOROSCOPY TIME: Less than 1 minute.   CONTRAST: Zero.   ESTIMATED BLOOD LOSS: Minimal.   INDICATION FOR PROCEDURE: An 81 year old gentleman with advanced lung cancer. He needs a Port-A-Cath for chemotherapy and durable venous access. We are asked to place this. Risks and benefits were discussed. Informed consent was obtained.   DESCRIPTION OF THE PROCEDURE: The patient was brought to the vascular and interventional radiology suite. The right neck and chest were sterilely prepped and draped, and a sterile surgical field was created. Ultrasound was used to help visualize a patent right internal jugular vein. This was then accessed under direct ultrasound guidance without difficulty with a Seldinger needle and a permanent image was recorded. A J-wire was placed. After skin nick and dilatation, the peel-away sheath was then placed over the wire. I then anesthetized an area under the clavicle approximately 2 fingerbreadths. A transverse incision was created and an inferior pocket was created with electrocautery and blunt dissection. The port was then brought onto the field, placed into the pocket and secured to the chest wall with 2 Prolene sutures. The catheter was connected to  the port and tunneled from the subclavicular incision to the access site. Fluoroscopic guidance was used to cut the catheter to an appropriate length. The catheter was then placed through the peel-away sheath and the peel-away sheath was removed. The catheter tip was parked in excellent location in the distal superior vena cava just above the right atrium. The pocket was then irrigated with antibiotic-impregnated saline and the wound was closed with a running 3-0 Vicryl and a 4-0 Monocryl. The access incision was closed with a single 4-0 Monocryl. The Huber needle was used to withdraw blood and flush the port with heparinized saline. Dermabond was then placed as a dressing. The patient tolerated the procedure well and was taken to the recovery room in stable condition.     ____________________________ Annice NeedyJason S. Dew, MD jsd:lb D: 07/12/2014 10:24:29 ET T: 07/12/2014 11:16:18 ET JOB#: [id number]  cc: Annice NeedyJason S. Dew, MD, <Dictator> Annice NeedyJASON S DEW MD ELECTRONICALLY SIGNED 07/12/2014 13:49

## 2015-02-17 NOTE — Consult Note (Signed)
ONCOLOGY followup note - continues to have weakness, cough and dyspnea on exertion, on nasal cannula oxygen. Denies hemoptysis.eating better. No fevers.sitting in bed, alert and oriented, NAD.          Vitals - 98.5, 90, 18, 122/72, 92% on 5L O2          HEENT - no oral thrush            Lungs - bilaterally diminish breath sounds overall, occasional rhonchi          Abdomen - soft, nontender.  hemoglobin 13.9, WBC 12900, ANC [id number], platelets 218K, Cr 0.92. diagnosed metastatic small cell lung cancer ( had CT-guided left lung mass biopsy on 04/26/14) with liver metastasis - patient and wife present were explained about biopsy confirming small cell lung cancer, that he has stage IV disease which is incurable and treatments offered are with palliative intent only. Patient unable to go home today given continued dyspnea, he also has advanced COPD. Have discussed treatment options and explained about palliative chemotherapy with regimen including carboplatin/etoposide and explained overall palliative intent of treatment, possible response rates and possible side effects. Have also advised that he needs to start chemotherapy as soon as possible given that this is an aggressive form of lung cancer. If he is still in hospital tomorrow, could start on chemotherapy as inpatient. Patient wants to discuss this with wife and daughter in detail and then make decision. No pain issues or hemoptysis. Will continue to follow.  Electronic Signatures: Izola PricePandit, Marleigh Kaylor Raj (MD)  (Signed on 03-Jul-15 17:38)  Authored  Last Updated: 03-Jul-15 17:38 by Izola PricePandit, Manning Luna Raj (MD)

## 2015-02-17 NOTE — Consult Note (Signed)
ONCOLOGY followup note - overall weakness, cough and dyspnea on exertion are same, continues on nasal cannula oxygen. Denies hemoptysis. Family present at bedside, state that  confusion is much better today. Noncontrast CT head negative for acute changes.eating fair. No fevers.sitting in bed, alert and oriented x3, NAD.          Vitals - 97.6, 82, 20, 111/70, 94% on 5L O2          HEENT - no oral thrush            Lungs - bilaterally diminish breath sounds overall, occasional rhonchi          Abdomen - soft, nontender.  hemoglobin 15.9, WBC 18500, ANC [id number], platelets 235K, Cr 0.98. diagnosed metastatic small cell lung cancer ( had CT-guided left lung mass biopsy on 04/26/14) with liver metastasis - patient and family present were explained about biopsy confirming small cell lung cancer, that he has stage IV disease which is incurable and treatments offered are with palliative intent only. Clinically doing about the same, confusion is improved today. Patient willing to start on palliative chemotherapy today with regimen including carboplatin/etoposide, have again explained overall palliative intent of treatment, possible response rates and possible side effects and he is agreeable and expressed verbal consent to take this treatment. Will transfer to Oncology floor and start cycle 1 chemo with lower dose Carboplatin AUC of 4 IV day1 and Etoposide 60 mg/m2 IV days 1-3 (given poor performance status of ECOG 3 and severe COPD). Neulasta inj on day 4. Will continue to follow. They are agreeable to this plan.    Electronic Signatures: Izola PricePandit, Nakyla Bracco Raj (MD)  (Signed on 05-Jul-15 16:54)  Authored  Last Updated: 05-Jul-15 16:54 by Izola PricePandit, Rodrigues Urbanek Raj (MD)

## 2015-02-17 NOTE — Discharge Summary (Signed)
PATIENT NAME:  [name] [name], [name] [name] MR#:  [id number] DATE OF BIRTH:  1932/05/04  DATE OF ADMISSION:  04/21/2014 DATE OF DISCHARGE:  05/04/2014  DIAGNOSES AT TIME OF DISCHARGE:  1.  Metastatic small cell lung cancer. 2.  Advanced chronic obstructive pulmonary disease.  3.  Hypertension.  4.  History of alcohol use.  5.  Anxiety.  6.  History of coronary artery disease.   CHIEF COMPLAINT: Shortness of breath, cough 2-3 weeks' duration.   HISTORY OF PRESENT ILLNESS: [name] [name] is a 81 year old male with a history of advanced COPD, hypertension, CAD, recently diagnosed with lung cancer, was sent from Dr. Loistine Chanceamiah's office for direct admit because of increasing shortness of breath, cough, productive sputum and hypoxemia. The patient also underwent a CT scan of the ED which showed multiple lesions in the chest as well liver, suggestive of metastatic disease. The patient also underwent a CT-guided biopsy of the right upper lobe, which showed evidence for small cell cancer. He was seen by oncologist, Dr. Sherrlyn HockPandit, and subsequently also by Dr. Wendie Simmeramiah. He did develop episodes of hypotension for which he was transferred to the CCU,  responded to IV fluids and low dose Levophed.   The patient developed confusion associated with anxiety from steroids and this was subsequently discontinued.  His shortness of breath improved to some extent. He did have cough with productive hemoptysis and responded well to IV antibiotics. The patient received chemotherapy by Dr. Wendie Simmeramiah and also received Neupogen. He was started on carboplatin/etoposide. Discussions were held with family and the patient who were both in agreement and they wanted to proceed with further chemotherapy. The patient will be followed up in the cancer center by Dr. Wendie Simmeramiah for this. He was also seen by palliative care while here in  the hospital and was relatively stable at the time of discharge. Home health nursing was also arranged for the patient to be   discharged.  DISCHARGE MEDICATIONS: Simvastatin 40 mg p.o. at bedtime, vitamin C 500 mg once a day, aspirin 81 mg a day, ProAir HFA 2 puffs 4 times a day, Symbicort 160/4.5, 2 puffs b.i.d. melatonin 3 mg at bedtime, temazepam 7.5 mg at bedtime, Prilosec 20 mg p.o. daily, selenium 50 mcg 2 tablets once a day.   FOLLOWUP: The patient was advised nebulized bronchodilator therapy with DuoNebs q. 4-6 hours p.r.n. Advised also 4 liters nasal cannula oxygen and follow up with Dr. Wendie Simmeramiah and also follow with me, Dr. Marcello FennelHande, 1-2 weeks' time.   TIME SPENT:  Total time spent on discharging this patient 35 minutes.    ____________________________ Barbette ReichmannVishwanath Skila Rollins, MD vh:ds D: 05/04/2014 18:00:12 ET T: 05/04/2014 22:09:50 ET JOB#: [id number]  cc: Barbette ReichmannVishwanath Juda Lajeunesse, MD, <Dictator> Barbette ReichmannVISHWANATH Ladislao Cohenour MD ELECTRONICALLY SIGNED 05/05/2014 16:03

## 2015-02-17 NOTE — H&P (Signed)
PATIENT NAME:  [name], [name] MR#:  [id number] DATE OF BIRTH:  Nov 19, 1931  DATE OF ADMISSION:  04/21/2014  CHIEF COMPLAINT: Shortness of breath and cough for 2-3 weeks, worsening recently.  HISTORY OF PRESENT ILLNESS:  A 81 year old Caucasian male with a history of COPD,  hypertension, CAD, recently diagnosed with lung cancer, was sent from Dr. Loistine Chance office to hospital for direct admission due to shortness of breath and cough for the past 2-3 weeks and worsening symptoms today. The patient's oxygen saturation was 85% on oxygen by nasal cannula. The patient got a CAT scan of the chest in the ED which showed multiple lesions in the chest, as well as the liver sighting for metastatic disease. The patient complains of severe shortness of breath and some cough, but no fever or chills. No wheezing. The patient complaints of generalized headache. The patient's lost weight, 10-15 pounds for the past few months. The patient has been on oxygen for the past few months.   PAST MEDICAL HISTORY:  Chronic obstructive pulmonary disease, hypertension, and coronary artery disease.   PAST SURGICAL HISTORY: CABG and bilateral carotid endarterectomy.   SOCIAL HISTORY: The patient quit smoking many years ago. He was a heavy smoker since 81 year old.  Drinks alcohol on a daily basis, about 2 cans of beer. Denies any drug abuse.   FAMILY HISTORY: Brother has cancer.  ALLERGIES:  LEVAQUIN, SULFA DRUG,  TETRACYCLINE, TROVAN.   HOME MEDICATIONS:  1.  Vitamin C 500 mg p.o. daily. 2.  Symbicort 160 mcg/4.5 micron 2 puffs b.i.d.  3.  Zocor 40 mg p.o. at bedtime.  4.  Selenium 58 mcg 2 tablets once a day. 5.  ProAir HFA CFC free 90 mcg 2 puffs 4 times a day.  6.  Prilosec OTC 20 mg p.o. once a day.  7.  Multivitamin 1 tablet once a day.  8.  Fish oil 1000 mg p.o. daily. 9.  Aspirin 81 mg p.o. daily.   REVIEW OF SYSTEMS:  CONSTITUTIONAL: The patient denies any fever or chills. No headache or dizziness, but has  generalized weakness and weight loss.  EYES: No double vision or blurred vision.  EARS, NOSE, AND THROAT: No postnasal drip, slurred speech or dysphagia.  CARDIOVASCULAR: No chest pain, palpitation, orthopnea, or nocturnal dyspnea. No leg edema.  PULMONARY: Positive for cough, sputum, shortness of breath, but no hemoptysis.  GASTROINTESTINAL: No abdominal pain, nausea, vomiting, or diarrhea. No melena or bloody stool.  GENITOURINARY: No dysuria, hematuria, or incontinence.  SKIN: No rash or jaundice.  NEUROLOGIC: No syncope, loss of consciousness, or seizure. HEMATOLOGY: No easy bruising or bleeding.  ENDOCRINE: No polyuria, polydipsia, heat or cold intolerance.   PHYSICAL EXAMINATION:  VITAL SIGNS: Temperature 97.6, blood pressure 109/78, pulse 88, oxygen saturation 94% on oxygen 6 liters by nasal cannula.  GENERAL: The patient is alert, awake, oriented, in no acute distress.  HEENT: Pupils round, equal, and reactive to light and accommodation.  NECK:  Supple.  No JVD or carotid bruits. No lymphadenopathy. No thyromegaly.  CARDIOVASCULAR: S1, S2, regular rate and rhythm. No murmurs or gallops.  PULMONARY: Bilateral air entry, very weak breath sounds with some crackles. No obvious wheezing. Mild use of accessory muscle to breathe.  ABDOMEN: Soft. No distention or tenderness. No organomegaly. Bowel sounds present.  EXTREMITIES: No edema, clubbing, or cyanosis. No calf tenderness. Bilateral pedal pulses present.  SKIN: No rash or jaundice.  NEUROLOGIC: A and O x 3. No focal deficit. Power 5/5. Sensation intact.  LABORATORY DATA: Chest CT with contrast showed large left hilar mass with a large AP window lymph node in the pleural-based masses along with early chest wall lesion around the left 3rd rib favoring malignancy such as small cell carcinoma with a pleural spread.   EKG showed normal sinus rhythm at 88 bpm. CBC in normal range. Glucose 104, BUN 15, creatinine 0.84, sodium 128, potassium  5.1, chloride 95, bicarbonate 28, calcium 10.3. Troponin less than 0.02. Urinalysis negative.  Urinalysis yesterday showed WBC 12, RBC 3. Chest x-ray yesterday showed progression of abnormality in the left hemithorax, worrisome for carcinoma or cancer with a hilar adenopathy.   IMPRESSIONS:  1.  Chronic obstructive pulmonary disease exacerbation.  2.  Lung cancer.  3.  Hyponatremia.  4.  Coronary artery disease. 5.  Hypertension.  6.  History of atrial fibrillation. 7.  Alcohol abuse.   PLAN OF TREATMENT:  1.  The patient is directly admitted to the medical floor where we will continue O2 via nasal cannula and nebulizer treatment.  We will start Solu-Medrol and Spiriva, and get an oncology consult.   2.  For hyponatremia, we will start normal saline. Follow up BMP.   3.  For alcohol abuse, we will start CIWA protocol.   Discussed the patient's condition and plan of treatment with the patient and the patient's wife,  daughter, and son. The patient wants FULL CODE.   TIME SPENT: About 55 minutes.     ____________________________ Terrel Nesheiwat, MD qc:ts D: 04/21/2014 Shaune Pollack14:54:48 ET T: 04/21/2014 16:11:57 ET JOB#: [id number]  cc: Shaune PollackQing Elexus Barman, MD, <Dictator> Shaune PollackQING Yulanda Diggs MD ELECTRONICALLY SIGNED 04/21/2014 21:56

## 2015-02-17 NOTE — Consult Note (Signed)
Brief Consult Note: Diagnosis: metastatic small cell lung ca.   Comments: Day 2/3 chemo today. Labs okay Vitals stable. For day 3 in am followed by Neupogen to prevent neutropenia. Will benefit from physical therapy if stable to help with mobilisation and discharge planning.  Electronic Signatures: Antony Hasteamiah, Oviya Ammar S (MD)  (Signed 08-Jul-15 13:06)  Authored: Brief Consult Note   Last Updated: 08-Jul-15 13:06 by Antony Hasteamiah, Quanell Loughney S (MD)

## 2015-02-17 NOTE — Consult Note (Signed)
   Comments   Wife present when entering room and pt started in a saying in a real loud voice that he cannot meet with me today.  Pt states he has to talk to his oncologist and cannot speak wih me today.  Pt appears SOB and attempted to speak about morphine for SOb and pt states that he cannot take that and he needs to speak with oncologist.  I wrote my name and service down for wife and asked for them to page me if he changes his mind.    Electronic Signatures: Reather LaurenceMantzouris, Rayhaan Huster J (NP)  (Signed 03-Jul-15 11:30)  Authored: Palliative Care   Last Updated: 03-Jul-15 11:30 by Reather LaurenceMantzouris, Taria Castrillo J (NP)

## 2015-02-17 NOTE — Consult Note (Signed)
ONCOLOGY followup note - overall weakness, cough and dyspnea on exertion are same, continues on nasal cannula oxygen. Denies hemoptysis. [name] [name].eating fair. No fevers.sitting in bed, alert and oriented x3, NAD.          Vitals - 97.8, 75, 20, 102/69, 94% on 5L O2          HEENT - no oral thrush            Lungs - bilaterally diminished breath sounds overall, occasional rhonchi          Abdomen - soft, nontender.  7/5 - Hb 15.9, WBC 18500, ANC [id number], platelets 235K, Cr 0.98. diagnosed metastatic small cell lung cancer ( had CT-guided left lung mass biopsy on 04/26/14) with liver metastasis - patient and family present were explained about biopsy confirming small cell lung cancer, that he has stage IV disease which is incurable and treatments offered are with palliative intent only. Clinically doing about the same, BP seems to have improved today. Patient willing to start on palliative chemotherapy today with regimen including carboplatin/etoposide, have again explained overall palliative intent of treatment, possible response rates and possible side effects and he is agreeable and expressed verbal consent to take this treatment. Will transfer to Oncology floor and start cycle 1 chemo with lower dose Carboplatin AUC of 4 IV day1 and Etoposide 60 mg/m2 IV days 1-3 (given poor performance status of ECOG 3 and severe COPD). Neulasta inj on day 4. Will continue to follow. They are agreeable to this plan. - patient again developed hypotension and being transferred to CCU. Will therefore need to hold off on starting chemotherapy. Have discussed code status, he still wants to think about it.    Electronic Signatures: Izola PricePandit, Hena Ewalt Raj (MD)  (Signed on 06-Jul-15 23:57)  Authored  Last Updated: 06-Jul-15 23:57 by Izola PricePandit, Jennalyn Cawley Raj (MD)

## 2015-02-18 NOTE — Op Note (Signed)
PATIENT NAME:  [name] [name], [name] [name] MR#:  [id number] DATE OF BIRTH:  11-Mar-1932  DATE OF PROCEDURE:  01/26/2012  PREOPERATIVE DIAGNOSIS:  Cataract, right eye.   POSTOPERATIVE DIAGNOSIS:  Cataract, right eye.  PROCEDURE PERFORMED:  Extracapsular cataract extraction using phacoemulsification with placement of an Alcon SN6CWS, 19.0-diopter posterior chamber lens, serial #[id number].   SURGEON:  Maylon PeppersSteven A. Pat Elicker, MD  ASSISTANT:  None.  ANESTHESIA:  4% lidocaine and 0.5% Marcaine in a 50/50 mixture with 10 units/mL of Hylenex  added, given as a peribulbar.  ANESTHESIOLOGIST:  Zena AmosWilliam Kephart, MD   COMPLICATIONS:  None.  ESTIMATED BLOOD LOSS:  Less than 1 mL.  DESCRIPTION OF PROCEDURE:  The patient was brought to the operating room and given a peribulbar block.  The patient was then prepped and draped in the usual fashion.  The vertical rectus muscles were imbricated using 5-0 silk sutures.  These sutures were then clamped to the sterile drapes as bridle sutures.  A limbal peritomy was performed extending two clock hours and hemostasis was obtained with cautery.  A partial thickness scleral groove was made at the surgical limbus and dissected anteriorly in a lamellar dissection using an Alcon crescent knife.  The anterior chamber was entered superonasally with a Superblade and through the lamellar dissection with a 2.6 mm keratome.  DisCoVisc was used to replace the aqueous and a continuous tear capsulorrhexis was carried out.  Hydrodissection and hydrodelineation were carried out with balanced salt and a 27 gauge canula.  The nucleus was rotated to confirm the effectiveness of the hydrodissection.  Phacoemulsification was carried out using a divide-and-conquer technique.  Total ultrasound time was one minute and 37 seconds with an average power of 16.2 percent.  CDE of 31.12.  Irrigation/aspiration was used to remove the residual cortex.  DisCoVisc was used to inflate the capsule and the  internal incision was enlarged to 3 mm with the crescent knife.  The intraocular lens was folded and inserted into the capsular bag using the AcrySert Delivery System. Irrigation/aspiration was used to remove the residual DisCoVisc.  Miostat was injected into the anterior chamber through the paracentesis track to inflate the anterior chamber and induce miosis.  The wound was checked for leaks and none were found. The conjunctiva was closed with cautery and the bridle sutures were removed.  Two drops of 0.3% Vigamox were placed on the eye.   An eye shield was placed on the eye.  The patient was discharged to the recovery room in good condition.  ____________________________ Maylon PeppersSteven A. Florette Thai, MD sad:cbb D: 01/26/2012 13:13:35 ET T: 01/26/2012 13:44:22 ET JOB#: [id number]  cc: Viviann SpareSteven A. Keira Bohlin, MD, <Dictator> Erline LevineSTEVEN A Reese Stockman MD ELECTRONICALLY SIGNED 01/27/2012 13:56

## 2015-02-18 NOTE — Op Note (Signed)
PATIENT NAME:  Caryl PinaSTEELE, Ector G MR#:  [id number] DATE OF BIRTH:  04/25/1932  DATE OF PROCEDURE:  12/08/2011  PREOPERATIVE DIAGNOSIS:  Cataract, left eye.  POSTOPERATIVE DIAGNOSIS:  Cataract, left eye.  PROCEDURE PERFORMED:  Extracapsular cataract extraction using phacoemulsification with placement of an Alcon SN6CWS 20.5-diopter posterior chamber lens, serial # [id number].  SURGEON:  Maylon PeppersSteven A. Ande Therrell, MD  ASSISTANT:  None.  ANESTHESIA:  4% lidocaine and 0.75% Marcaine in a 50/50 mixture with 10 units/mL of Hylenex added, given as a peribulbar.  ANESTHESIOLOGIST:  Dr. Dimple Caseyice.   COMPLICATIONS:  None.  ESTIMATED BLOOD LOSS:  Less than 1 mL.  DESCRIPTION OF PROCEDURE:  The patient was brought to the operating room and given a peribulbar block.  The patient was then prepped and draped in the usual fashion.  The vertical rectus muscles were imbricated using 5-0 silk sutures.  These sutures were then clamped to the sterile drapes as bridle sutures.  A limbal peritomy was performed extending two clock hours and hemostasis was obtained with cautery.  A partial thickness scleral groove was made at the surgical limbus and dissected anteriorly in a lamellar dissection using an Alcon crescent knife.  The anterior chamber was entered supero-temporally with a Superblade and through the lamellar dissection with a 2.6 mm keratome.  DisCoVisc was used to replace the aqueous and a continuous tear capsulorrhexis was carried out.  Hydrodissection and hydrodelineation were carried out with balanced salt and a 27 gauge canula.  The nucleus was rotated to confirm the effectiveness of the hydrodissection.  Phacoemulsification was carried out using a divide-and-conquer technique.  Total ultrasound time was 1 minute seconds with an average power of 20 percent.  Irrigation/aspiration was used to remove the residual cortex.  DisCoVisc was used to inflate the capsule and the internal incision was enlarged to 3 mm  with the crescent knife.  The intraocular lens was folded and inserted into the capsular bag using the AcrySert delivery system.  Irrigation/aspiration was used to remove the residual DisCoVisc.  Miostat was injected into the anterior chamber through the paracentesis track to inflate the anterior chamber and induce miosis.  The wound was checked for leaks and wound leakage was found.  A single 10-0 suture was placed across the incision, tied and the knot was rotated superiorly.  The conjunctiva was closed with cautery and the bridle sutures were removed.  Two drops of 0.3% Vigamox were placed on the eye.   An eye shield was placed on the eye.  The patient was discharged to the recovery room in good condition.  ____________________________ Maylon PeppersSteven A. Alexis Reber, MD sad:cms D: 12/08/2011 13:06:11 ET T: 12/08/2011 13:46:58 ET JOB#: [id number]  cc: Viviann SpareSteven A. Danelia Snodgrass, MD, <Dictator>  Erline LevineSTEVEN A Chace Bisch MD ELECTRONICALLY SIGNED 12/15/2011 13:31

## 2015-11-29 IMAGING — CR DG CHEST 2V
1 series · 2 of 2 positions shown · non-contrast
Comparison: 06/12/2011.

CLINICAL DATA: Cough and shortness of Breath.

EXAM:
CHEST  2 VIEW

[Series 2: w chest pa · 0.14mm/px · 2 of 2 slices shown]
[im 1/2]
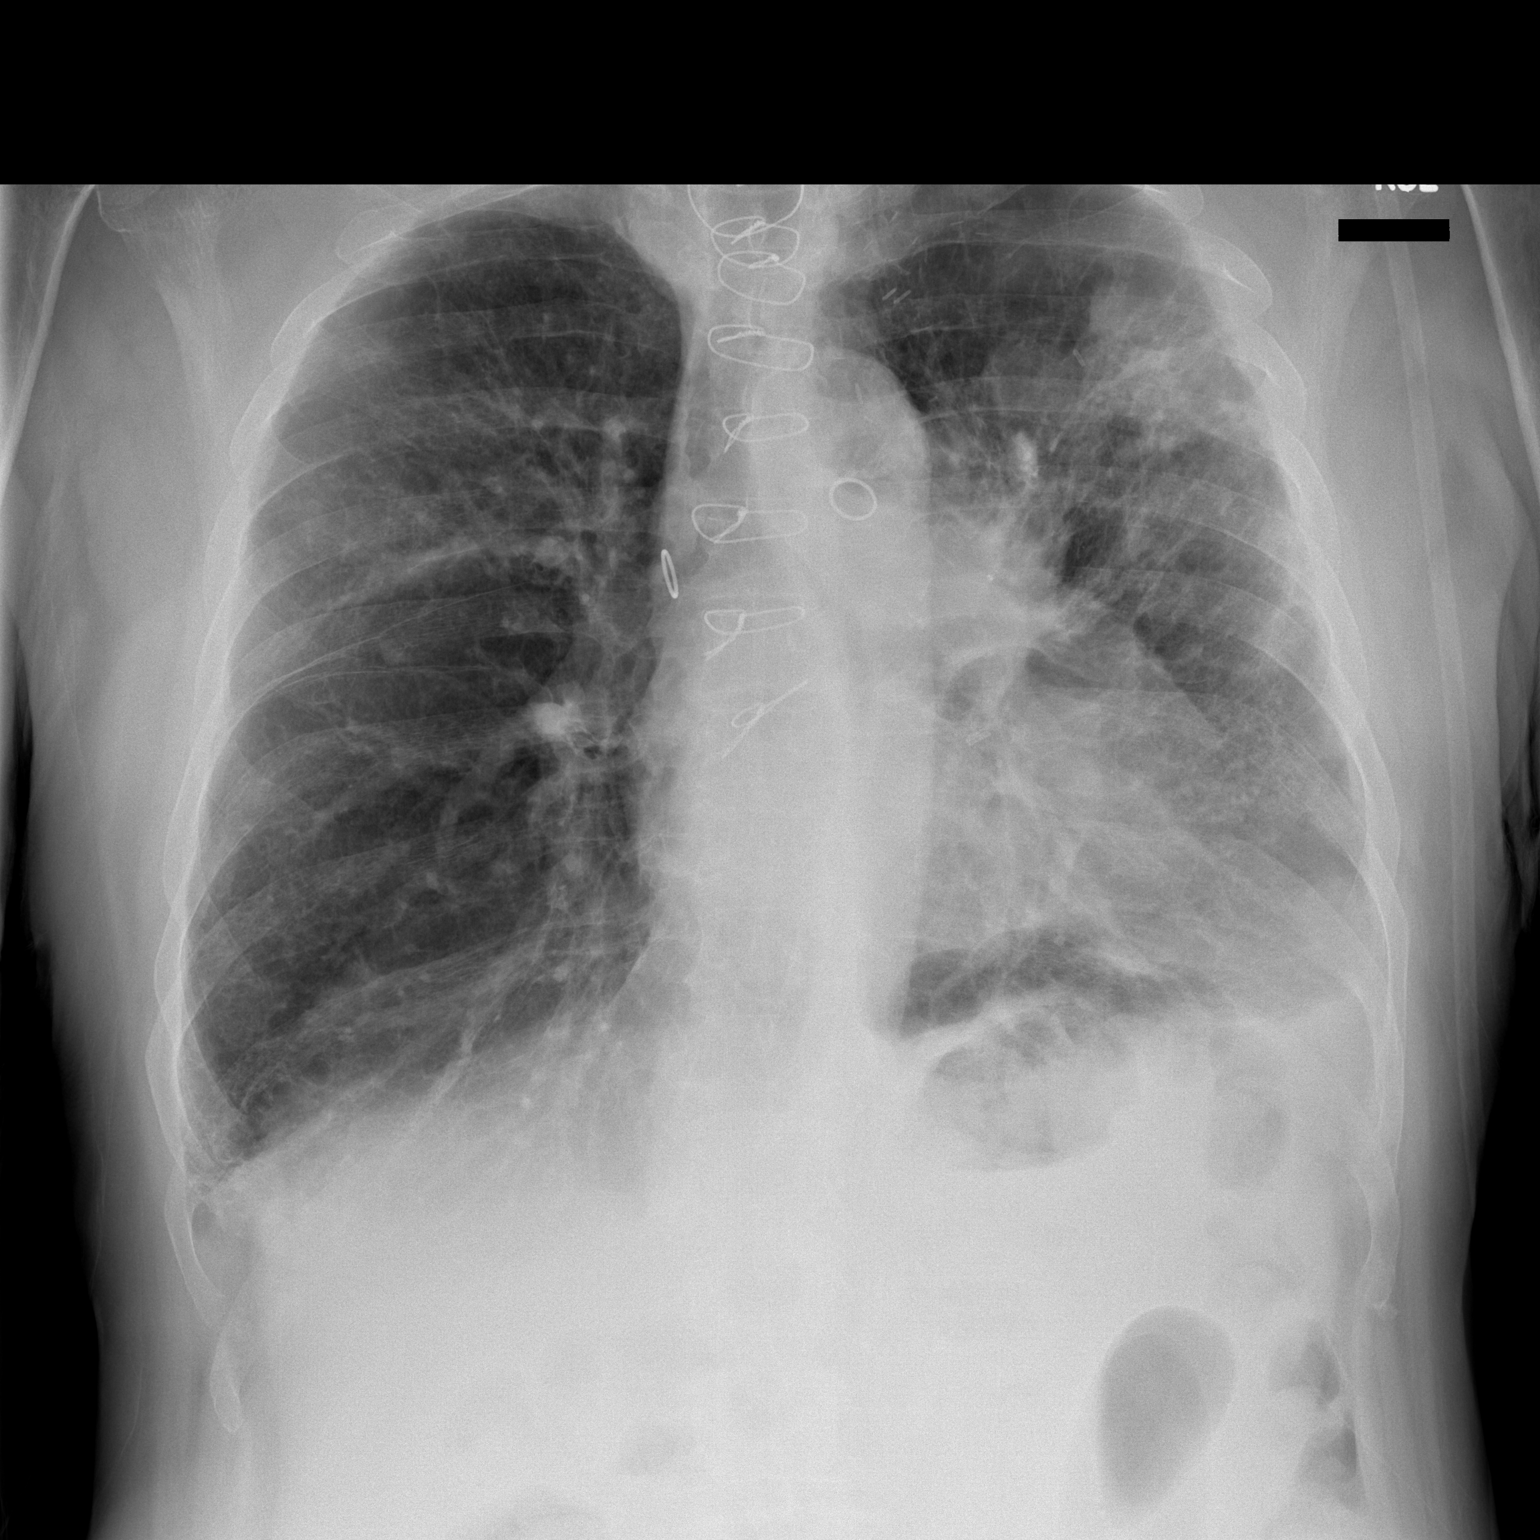
[im 2/2]
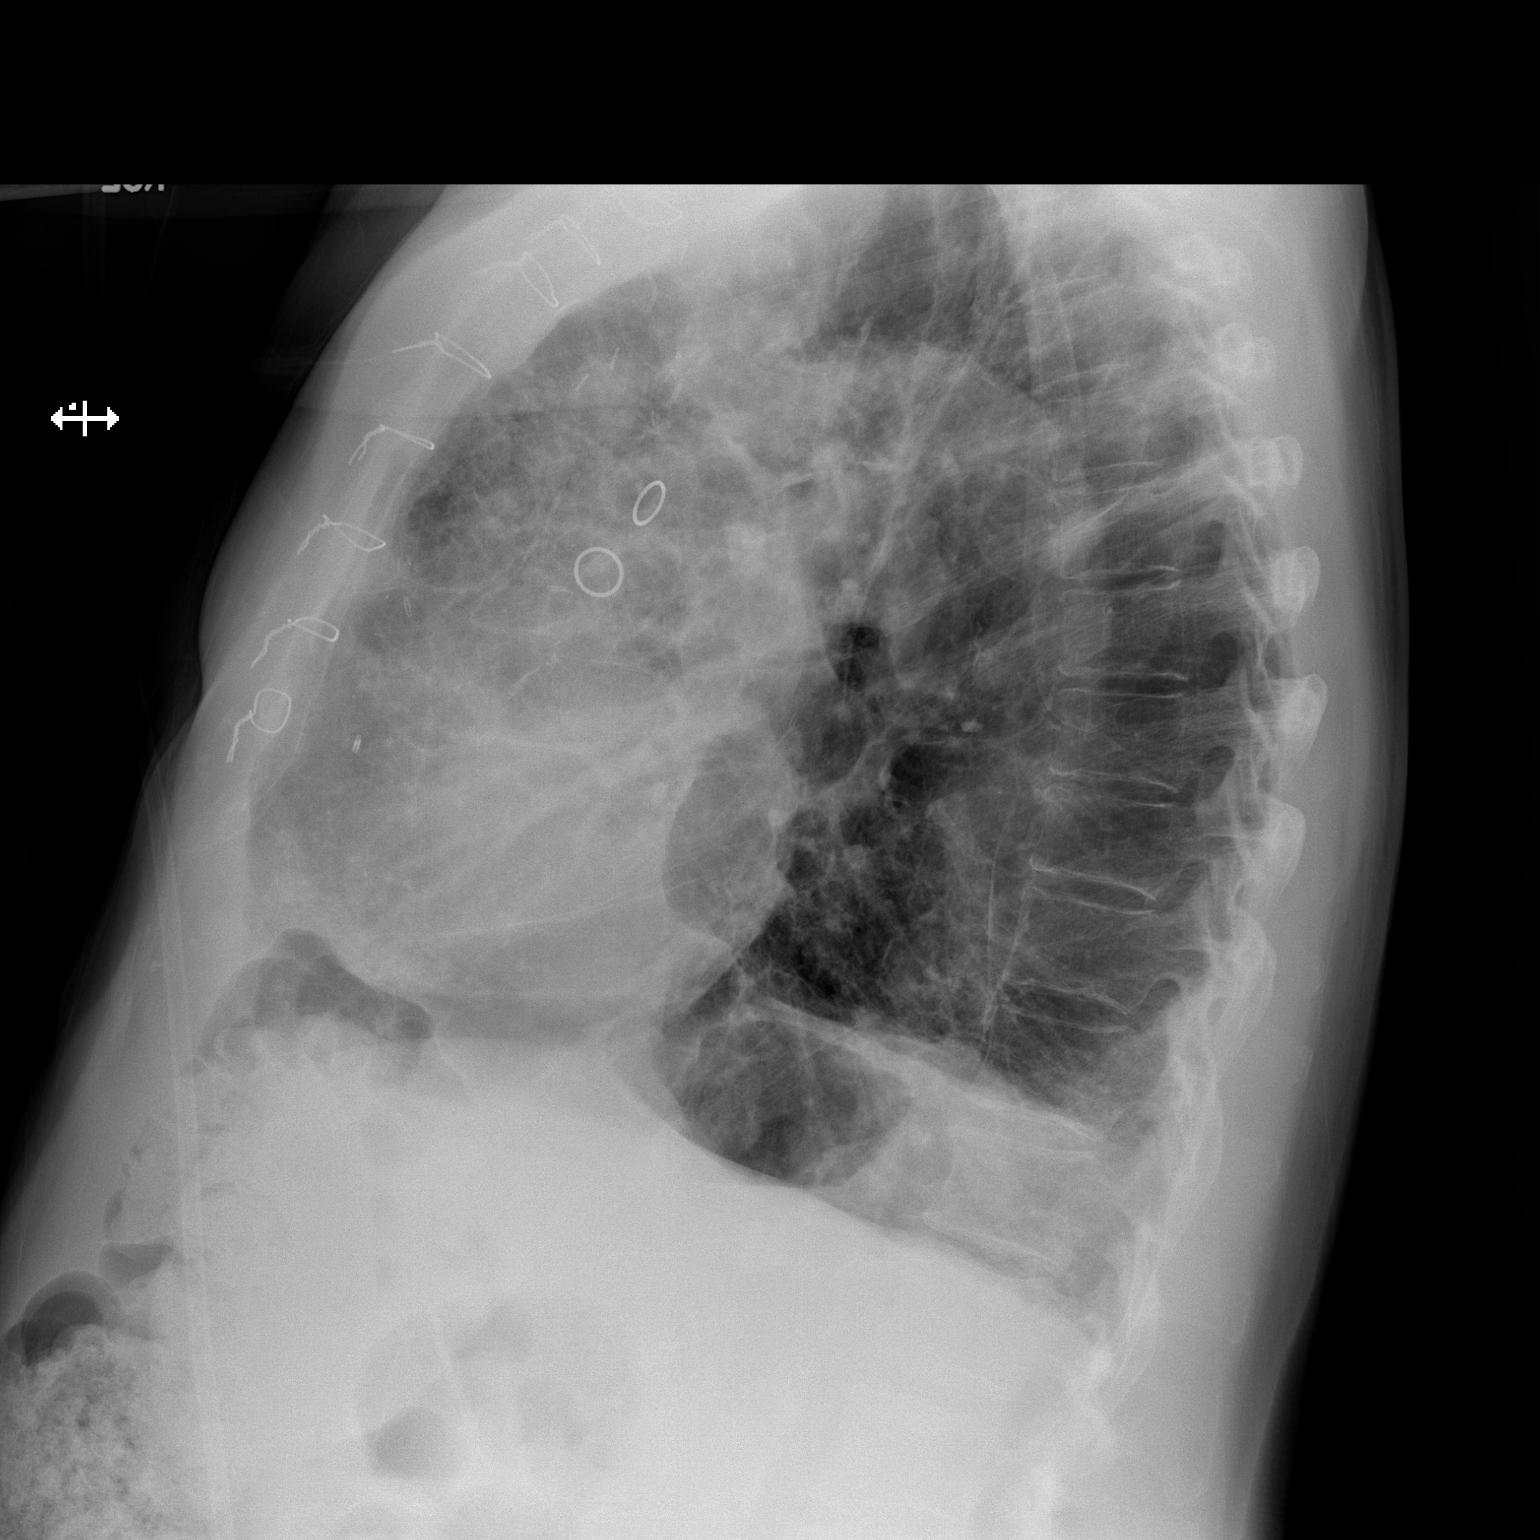

[2 of 2 positions shown; findings below may reference images not displayed]

FINDINGS: The heart is upper limits of normal in size given the AP projection.
There is tortuosity and calcification of the thoracic aorta. Stable
surgical changes related to bypass surgery.

There are chronic emphysematous and bronchitic lung changes with
findings suspicious for superimposed bilateral infiltrates. Could
not exclude the possibility of a left upper lobe mass or masses. I
would recommend a post treatment followup chest x-ray 3D 4 weeks to
re-evaluate. No pleural effusion. The bony thorax is intact.
IMPRESSION: Chronic underlying emphysema and pulmonary scarring with probable
superimposed bilateral infiltrates. Recommend post treatment
follow-up chest x-ray to make sure these resolve.

## 2015-12-15 IMAGING — CR DG CHEST 1V PORT
1 series · 1 of 1 positions shown · non-contrast
Comparison: CT biopsy images of 04/26/2014 and chest x-ray of
04/20/2014

CLINICAL DATA: Biopsy of left lung lesion

EXAM:
PORTABLE CHEST - 1 VIEW

[ap]
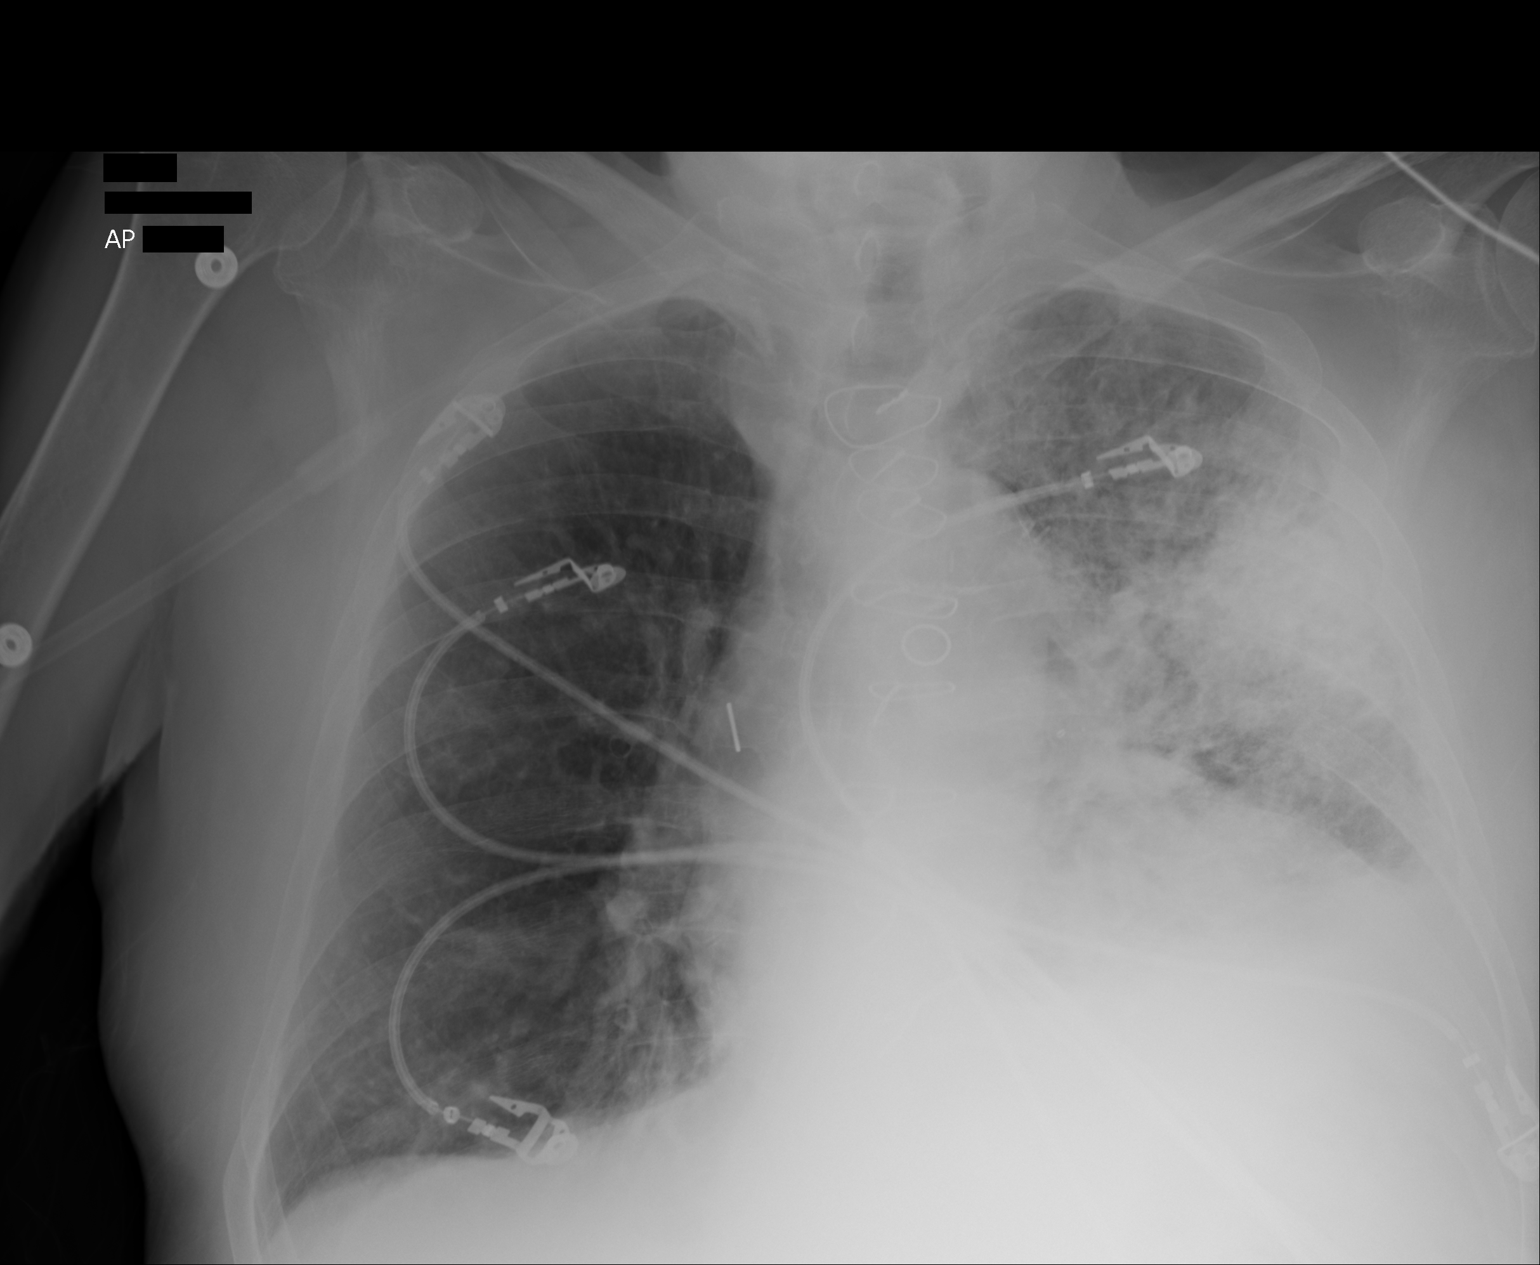

[1 of 1 positions shown; findings below may reference images not displayed]

FINDINGS: There is now airspace disease surrounding much of the mass within
the left upper lobe consistent with hemorrhage post biopsy. No
pneumothorax is seen. The right lung is clear. Cardiomegaly is
stable.
IMPRESSION: More airspace disease around the mass in the left upper lobe most
consistent with post biopsy hemorrhage.

## 2015-12-17 IMAGING — CR DG CHEST 1V PORT
1 series · 2 of 2 positions shown · non-contrast
Comparison: 04/26/2014

CLINICAL DATA: COPD, lung mass

EXAM:
PORTABLE CHEST - 1 VIEW

[Series 1: ap · 0.17mm/px · 2 of 2 slices shown]
[im 1/2]
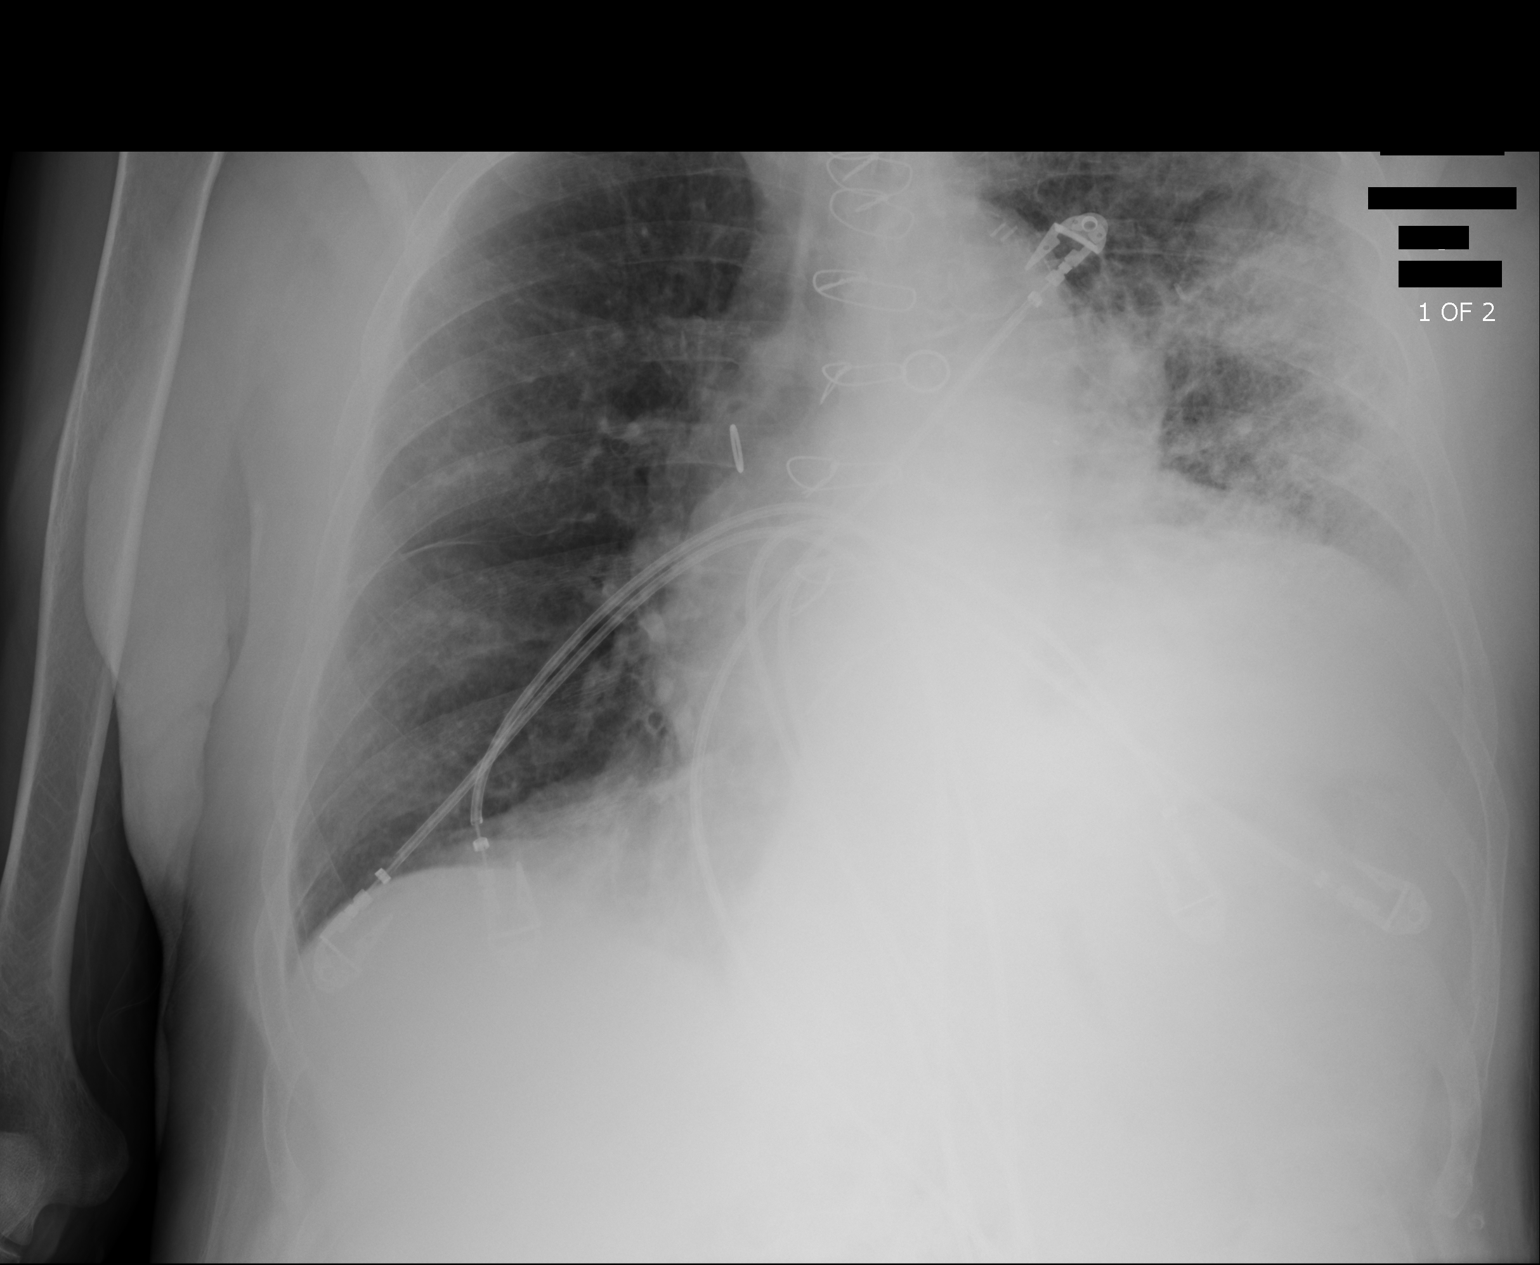
[im 2/2]
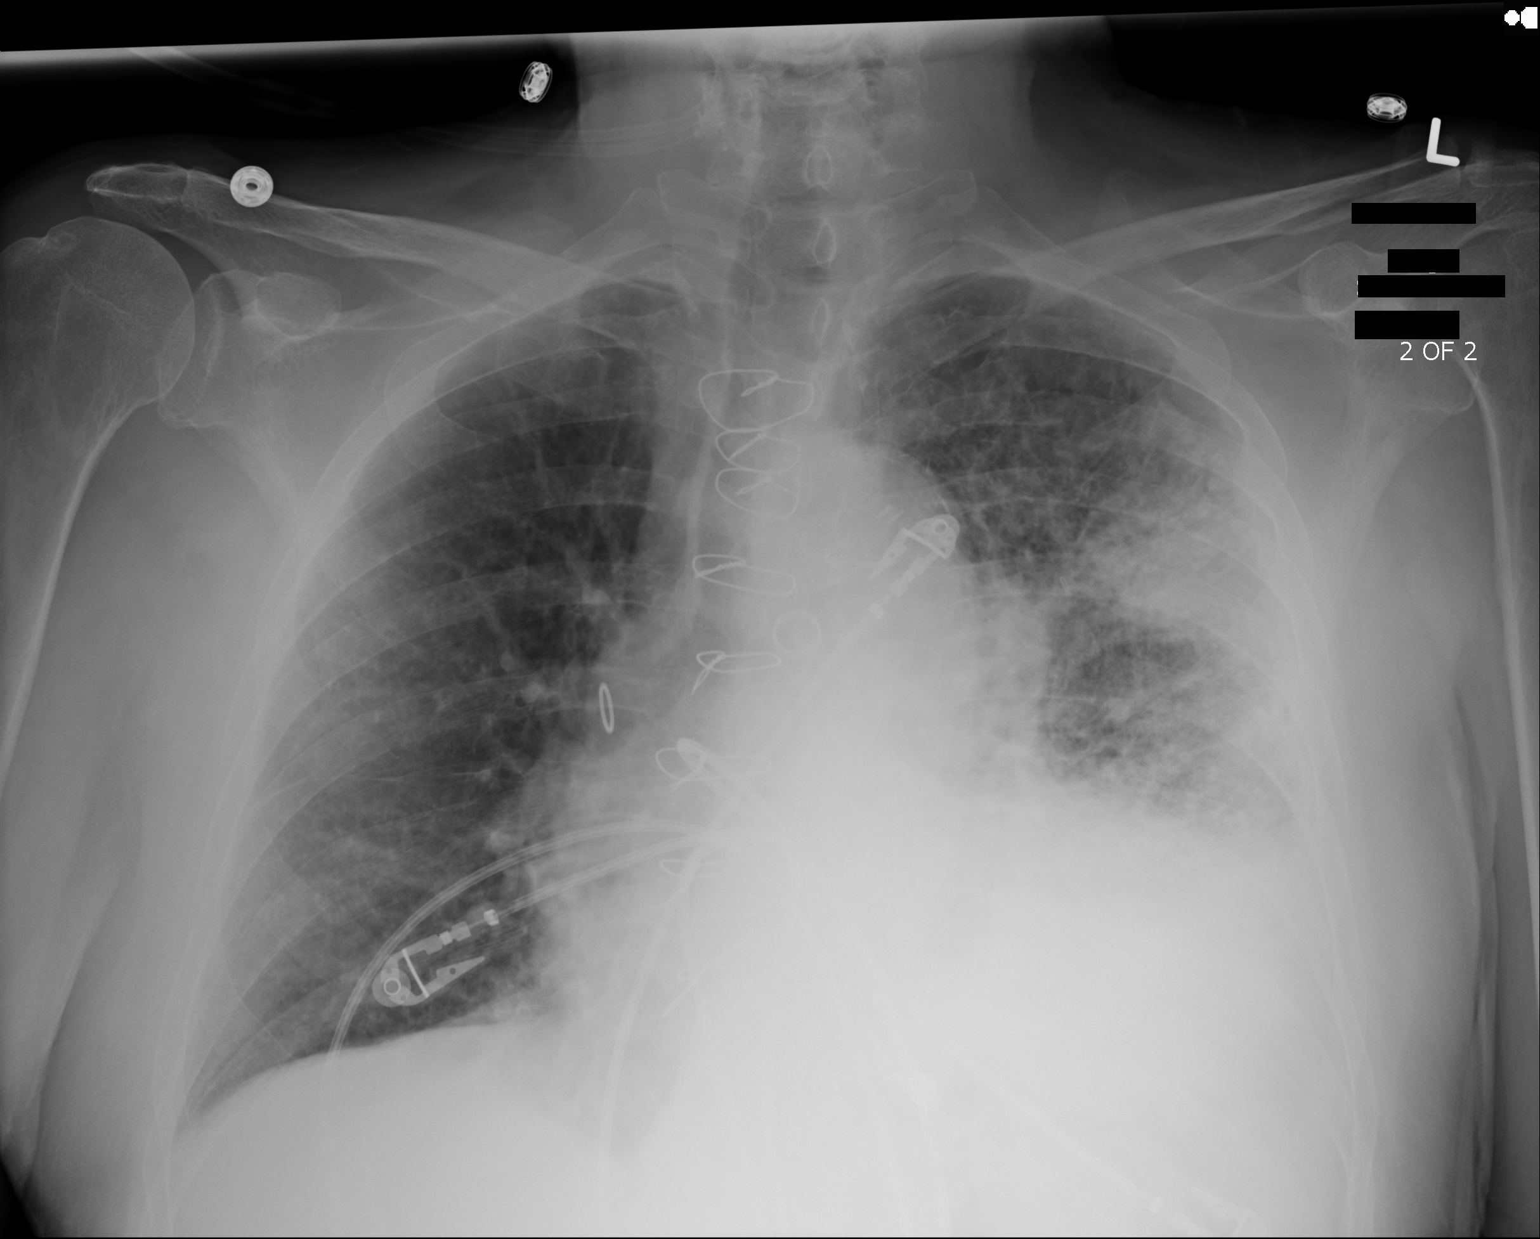

[2 of 2 positions shown; findings below may reference images not displayed]

FINDINGS: Cardiac shadow remains enlarged. Postsurgical changes are again
seen. The right lung remains clear. Persistent density related to
the known lung masses is noted on the left. The degree of
parenchymal density seen on the prior exam has improved consistent
with resolution of post biopsy hemorrhage. No pneumothorax is noted.
IMPRESSION: Significant improvement in post biopsy hemorrhage on the left. No
pneumothorax is seen.

## 2015-12-20 IMAGING — CR DG CHEST 1V PORT
1 series · 1 of 1 positions shown · non-contrast
Comparison: 04/28/2014.

CLINICAL DATA: PICC LINE PLACEMENT.

EXAM:
PORTABLE CHEST - 1 VIEW

[ap]
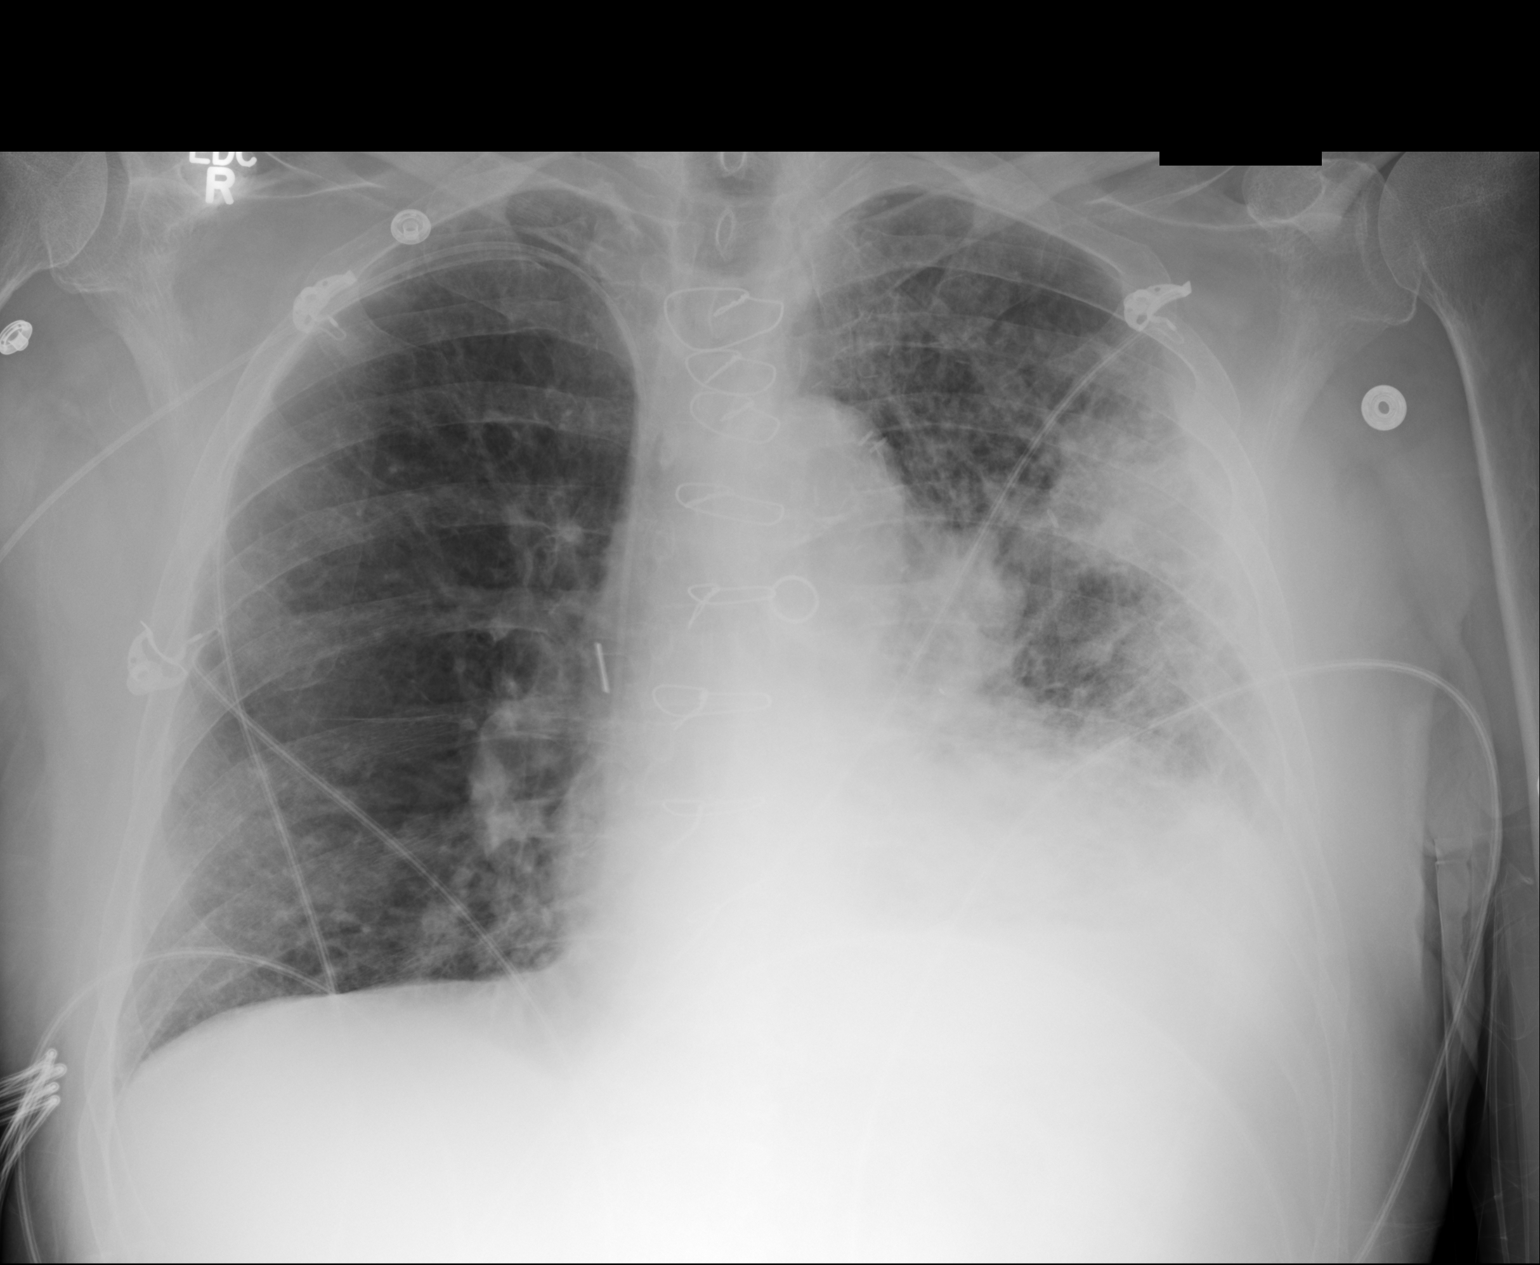

[1 of 1 positions shown; findings below may reference images not displayed]

FINDINGS: Left lung mass is re- demonstrated. Continued clearing of post
biopsy parenchymal hemorrhage in the left lung. Cardiomegaly with
prior CABG.

PICC line has been inserted from a right arm approach. Tip lies
within the right atrium and should be withdrawn approximately 5 cm
to lie at the cavoatrial junction.
IMPRESSION: Improved aeration.  Left lung mass for cysts.

PICC line should be withdrawn 5 cm.  No pneumothorax.

## 2015-12-20 IMAGING — CR DG CHEST 1V PORT
1 series · 1 of 1 positions shown · non-contrast
Comparison: 05/01/2014

CLINICAL DATA: PICC line placement.

EXAM:
PORTABLE CHEST - 1 VIEW

[ap]
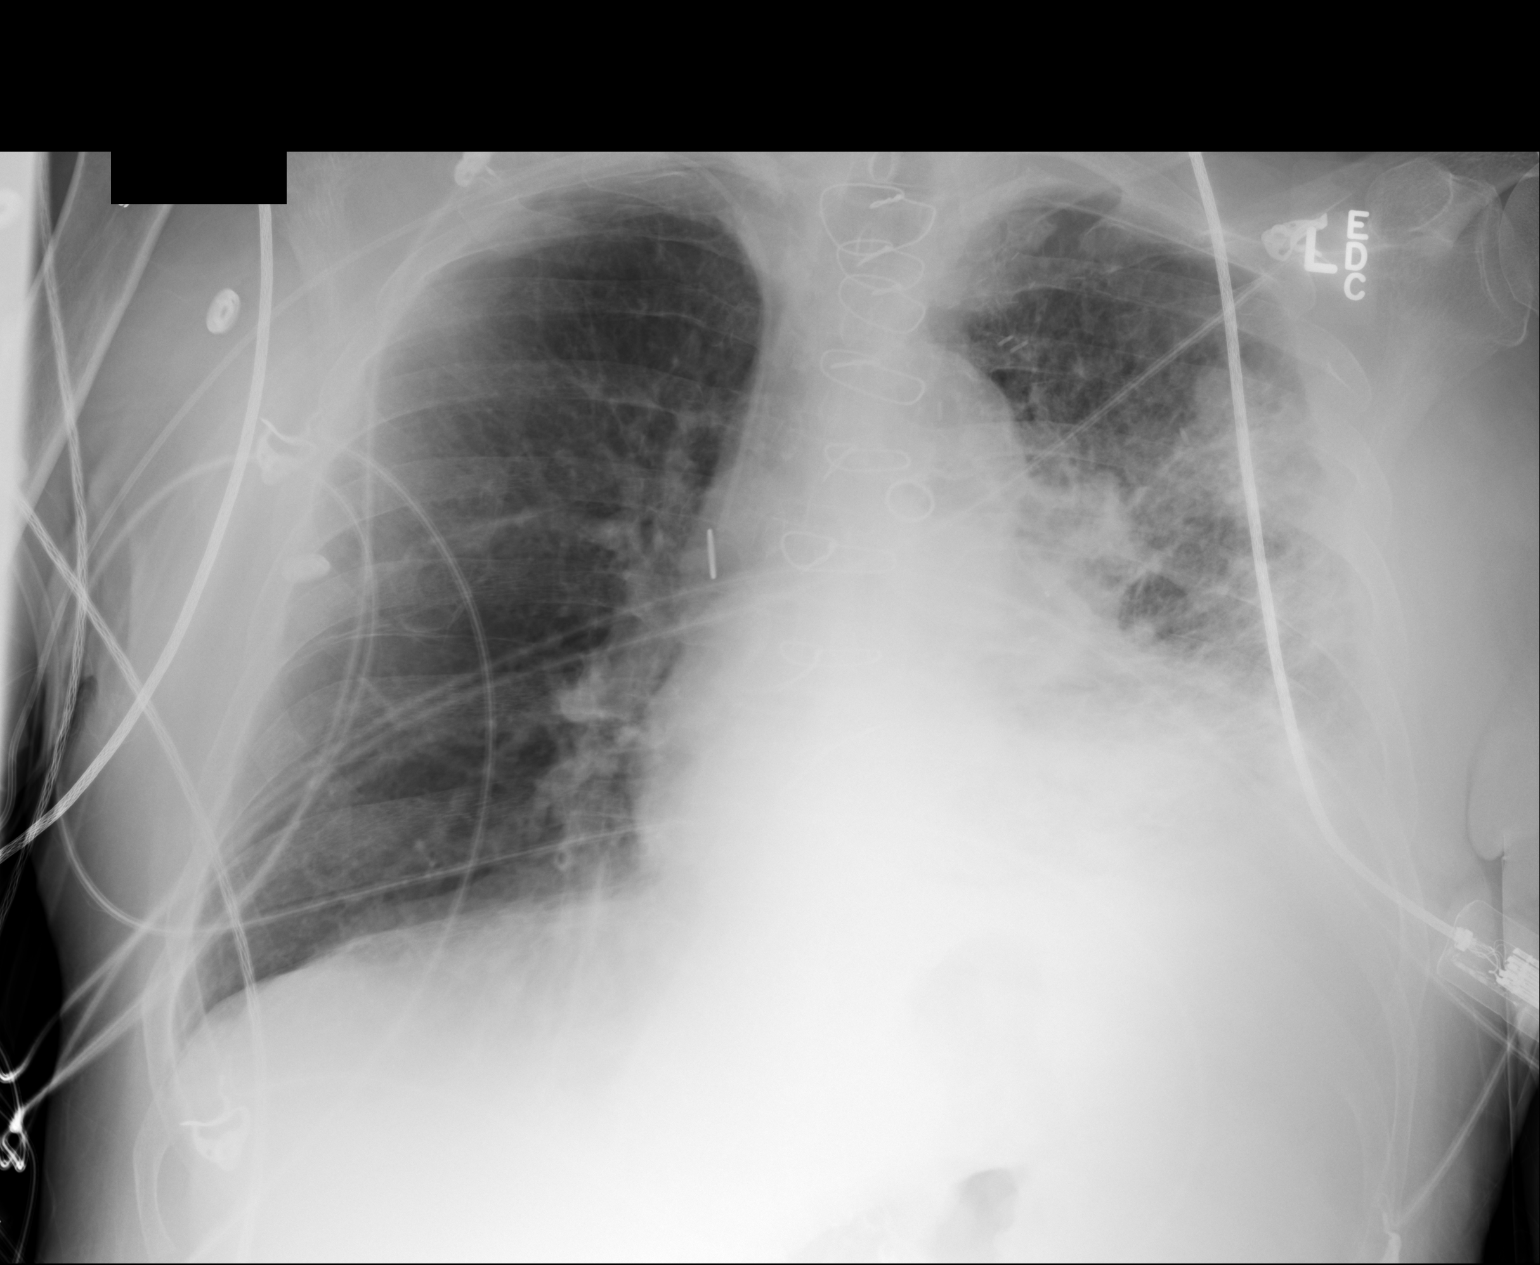

[1 of 1 positions shown; findings below may reference images not displayed]

FINDINGS: The right PICC line with tip projected over the lower SVC region. No
pneumothorax. Shallow inspiration with elevation of the left
hemidiaphragm. Mild cardiac enlargement. Pulmonary vascularity is
normal. Mass, infiltration, and volume loss in the left lung similar
to prior study. Probable emphysematous changes.
IMPRESSION: Right PICC line tip projects over the low SVC region. No
pneumothorax.
